# Patient Record
Sex: Male | Born: 1990 | Race: Black or African American | Hispanic: No | Marital: Single | Smoking: Never smoker
Health system: Southern US, Community
[De-identification: ages and names within clinical notes are randomized; demographics above are authoritative.]

---
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
## 2009-09-17 ENCOUNTER — Ambulatory Visit: Payer: Self-pay | Admitting: Family Medicine

## 2009-09-20 ENCOUNTER — Ambulatory Visit: Payer: Self-pay | Admitting: Family Medicine

## 2009-10-15 ENCOUNTER — Ambulatory Visit: Payer: Self-pay | Admitting: Family Medicine

## 2010-01-05 ENCOUNTER — Ambulatory Visit: Admission: AD | Admit: 2010-01-05 | Discharge: 2010-01-05 | Payer: Self-pay | Admitting: Emergency Medicine

## 2011-02-13 ENCOUNTER — Ambulatory Visit (INDEPENDENT_AMBULATORY_CARE_PROVIDER_SITE_OTHER): Payer: PRIVATE HEALTH INSURANCE | Admitting: Family Medicine

## 2011-02-13 DIAGNOSIS — N342 Other urethritis: Secondary | ICD-10-CM

## 2011-02-13 DIAGNOSIS — Z209 Contact with and (suspected) exposure to unspecified communicable disease: Secondary | ICD-10-CM

## 2013-05-03 ENCOUNTER — Encounter: Payer: Self-pay | Admitting: Medical

## 2013-05-03 ENCOUNTER — Ambulatory Visit (INDEPENDENT_AMBULATORY_CARE_PROVIDER_SITE_OTHER): Payer: PRIVATE HEALTH INSURANCE | Admitting: Medical

## 2013-05-03 VITALS — BP 112/70 | HR 78 | Temp 98.4°F | Resp 16 | Wt 253.0 lb

## 2013-05-03 DIAGNOSIS — I498 Other specified cardiac arrhythmias: Secondary | ICD-10-CM

## 2013-05-03 DIAGNOSIS — L729 Follicular cyst of the skin and subcutaneous tissue, unspecified: Secondary | ICD-10-CM

## 2013-05-03 DIAGNOSIS — R001 Bradycardia, unspecified: Secondary | ICD-10-CM

## 2013-05-03 DIAGNOSIS — L723 Sebaceous cyst: Secondary | ICD-10-CM

## 2013-05-03 NOTE — Progress Notes (Signed)
Subjective: Here for bump on left side of ribs x months.  Noticed out of the blue one day.  No change in size, doesn't hurt, no fever, no chills, no changes.  Along lower left anterior ribs.    He has concern about his pulse.  He donates plasma and on a handful of occasions has been declined at the plasma center due to low pulse in the 40s.  He denies any symptoms.  No chest pain, no palpitations, no edema, no syncope, no flushing.  No family hx/o heart disease.  Feels fine in general.  Exercises some, not daily, but at times can walk/run on the treadmill for an hour without c/o.   History reviewed. No pertinent past medical history.  ROS as in subjective   Objective: Filed Vitals:   05/03/13 1341  BP: 112/70  Pulse: 78  Temp: 98.4 F (36.9 C)  Resp: 16    General appearance: alert, no distress, WD/WN Skin: left lower anterior chest along 9th rib with subcutaneous with 3-4 mm diameter mobile, slightly tender, cystic lesion, no erythema, no induration, no warmth, no fluctuance, suggestive of cyst Neck: supple, no lymphadenopathy, no thyromegaly, no masses Heart: bradycardic at 58, otherwise RRR, normal S1, S2, no murmurs Lungs: CTA bilaterally, no wheezes, rhonchi, or rales Pulses: 2+ symmetric, upper and lower extremities, normal cap refill Ext: no edema   Adult ECG Report  Indication: bradycardia  Rate: 43bpm  Rhythm: sinus bradycardia  QRS Axis: 56 degrees  PR Interval:  QRS Duration: 98ms  QTc:  Conduction Disturbances: none  Other Abnormalities: none  Patient's cardiac risk factors are: none.  EKG comparison: none  Narrative Interpretation: marked sinus bradycardia, otherwise normal   Assessment: Encounter Diagnoses  Name Primary?  . Bradycardia Yes  . Cyst of skin      Plan: Bradycardia - asymptomatic.  Discussed EKG and case with Dr. Susann Givens, supervising physician.   We will see him back for labs, physical.  Pending labs, may consider baseline  cardiology evaluation.    Cyst of skin - no worrisome findings.  Reassured.  Call/return if worsening symptoms.   Follow-up soon for CPX

## 2013-05-25 ENCOUNTER — Encounter: Payer: Self-pay | Admitting: Medical

## 2013-06-26 ENCOUNTER — Emergency Department (HOSPITAL_COMMUNITY)
Admission: EM | Admit: 2013-06-26 | Discharge: 2013-06-27 | Disposition: A | Discharge status: Home / Self Care | Payer: PRIVATE HEALTH INSURANCE | Attending: Emergency Medicine | Admitting: Emergency Medicine

## 2013-06-26 ENCOUNTER — Encounter (HOSPITAL_COMMUNITY): Payer: Self-pay | Admitting: Emergency Medicine

## 2013-06-26 DIAGNOSIS — Z87891 Personal history of nicotine dependence: Secondary | ICD-10-CM | POA: Insufficient documentation

## 2013-06-26 DIAGNOSIS — R109 Unspecified abdominal pain: Secondary | ICD-10-CM

## 2013-06-26 DIAGNOSIS — R1013 Epigastric pain: Secondary | ICD-10-CM | POA: Insufficient documentation

## 2013-06-26 DIAGNOSIS — R1011 Right upper quadrant pain: Secondary | ICD-10-CM | POA: Insufficient documentation

## 2013-06-26 LAB — CBC WITH DIFFERENTIAL/PLATELET
Basophils Absolute: 0 10*3/uL (ref 0.0–0.1)
Basophils Relative: 1 % (ref 0–1)
Eosinophils Absolute: 0.5 10*3/uL (ref 0.0–0.7)
HCT: 41.6 % (ref 39.0–52.0)
Hemoglobin: 14.7 g/dL (ref 13.0–17.0)
Lymphocytes Relative: 38 % (ref 12–46)
MCH: 30.2 pg (ref 26.0–34.0)
MCHC: 35.3 g/dL (ref 30.0–36.0)
MCV: 85.6 fL (ref 78.0–100.0)
Neutro Abs: 3 10*3/uL (ref 1.7–7.7)
Neutrophils Relative %: 46 % (ref 43–77)
RDW: 12.5 % (ref 11.5–15.5)
WBC: 6.5 10*3/uL (ref 4.0–10.5)

## 2013-06-26 MED ORDER — GI COCKTAIL ~~LOC~~
30.0000 mL | Freq: Once | ORAL | Status: AC
  Administered 2013-06-26: 30 mL via ORAL
  Filled 2013-06-26: qty 30

## 2013-06-26 NOTE — ED Notes (Signed)
Med given 

## 2013-06-26 NOTE — ED Notes (Signed)
Patient with abdominal burning after eating chips and plum.  Patient denies any nausea or vomiting.  No other symptoms.

## 2013-06-26 NOTE — ED Provider Notes (Signed)
CSN: [id number]     Arrival date & time 06/26/13  2148 History   This chart was scribed for non-physician practitioner, Junious Silk PA-C, working with Audree Camel, MD by Arlan Organ, ED Scribe. This patient was seen in room TR11C/TR11C and the patient's care was started at 10:26 PM.    Chief Complaint  Patient presents with  . Abdominal Pain   The history is provided by the patient. No language interpreter was used.    HPI Comments: Barry Whitney is a 22 y.o. male who presents to the Emergency Department complaining of abdominal pain that started about an hour ago. Pt states he has experienced a "whoozy" feeling. Pt states he has taken some Pepto Bismol with no relief. Pt states he has eaten wings, french fries, chips, and a plum today. He states his last BM was last night. Pt denies nausea, emesis, and diarrhea. Pt states he has no current medical problems, but did have a heart murmur when he was a child.   History reviewed. No pertinent past medical history. History reviewed. No pertinent past surgical history. History reviewed. No pertinent family history. History  Substance Use Topics  . Smoking status: Never Smoker   . Smokeless tobacco: Not on file  . Alcohol Use: No    Review of Systems  Gastrointestinal: Positive for abdominal pain. Negative for nausea, vomiting and diarrhea.  All other systems reviewed and are negative.    Allergies  Review of patient's allergies indicates no known allergies.  Home Medications  No current outpatient prescriptions on file. BP 135/73  Pulse 46  Temp(Src) 98.7 F (37.1 C) (Oral)  Resp 16  SpO2 100% Physical Exam  Nursing note and vitals reviewed. Constitutional: He is oriented to person, place, and time. He appears well-developed and well-nourished. No distress.  HENT:  Head: Normocephalic and atraumatic.  Right Ear: External ear normal.  Left Ear: External ear normal.  Nose: Nose normal.  Eyes: Conjunctivae are  normal.  Neck: Normal range of motion. No tracheal deviation present.  Cardiovascular: Normal rate, regular rhythm and normal heart sounds.   Pulmonary/Chest: Effort normal and breath sounds normal. No stridor.  Abdominal: Soft. He exhibits no distension. There is no rebound and no guarding.  Tenderness to palpation over RUQ and epigastric region. Murphy's sign negative.  Musculoskeletal: Normal range of motion.  Neurological: He is alert and oriented to person, place, and time.  Skin: Skin is warm and dry. He is not diaphoretic.  Psychiatric: He has a normal mood and affect. His behavior is normal.    ED Course  Procedures (including critical care time)  DIAGNOSTIC STUDIES: Oxygen Saturation is 100% on RA, Normal by my interpretation.    COORDINATION OF CARE: 10:27 PM-Discussed treatment plan at bedside. Pt agreed to plan.     Labs Review Labs Reviewed  CBC WITH DIFFERENTIAL - Abnormal; Notable for the following:    Eosinophils Relative 8 (*)    All other components within normal limits  COMPREHENSIVE METABOLIC PANEL - Abnormal; Notable for the following:    Glucose, Bld 115 (*)    GFR calc non Af Amer 86 (*)    All other components within normal limits  LIPASE, BLOOD   Imaging Review No results found.  MDM   1. Abdominal pain    Patient is nontoxic, nonseptic appearing, in no apparent distress.  Patient's pain and other symptoms adequately managed in emergency department.  Labs and vitals reviewed.  Patient does not meet the SIRS  or Sepsis criteria.  On repeat exam patient does not have a surgical abdomen and there are no peritoneal signs.  No indication of appendicitis, bowel obstruction, bowel perforation, cholecystitis, diverticulitis.  Patient discharged home with symptomatic treatment and given strict instructions for follow-up with their primary care physician.  I have also discussed reasons to return immediately to the ER.  Patient expresses understanding and agrees  with plan. I discussed case with Dr. Criss Alvine who agrees with plan.   I personally performed the services described in this documentation, which was scribed in my presence. The recorded information has been reviewed and is accurate.         Mora Bellman, PA-C 06/27/13 [phone]

## 2013-06-27 ENCOUNTER — Ambulatory Visit (INDEPENDENT_AMBULATORY_CARE_PROVIDER_SITE_OTHER): Payer: PRIVATE HEALTH INSURANCE | Admitting: Family Medicine

## 2013-06-27 ENCOUNTER — Encounter: Payer: Self-pay | Admitting: Family Medicine

## 2013-06-27 VITALS — BP 90/64 | HR 52 | Temp 98.4°F | Ht 75.0 in | Wt 254.0 lb

## 2013-06-27 DIAGNOSIS — R1013 Epigastric pain: Secondary | ICD-10-CM

## 2013-06-27 DIAGNOSIS — R109 Unspecified abdominal pain: Secondary | ICD-10-CM

## 2013-06-27 LAB — POCT URINALYSIS DIPSTICK
Blood, UA: NEGATIVE
Glucose, UA: NEGATIVE
Ketones, UA: NEGATIVE
Leukocytes, UA: NEGATIVE
Urobilinogen, UA: NEGATIVE

## 2013-06-27 LAB — COMPREHENSIVE METABOLIC PANEL
BUN: 14 mg/dL (ref 6–23)
CO2: 26 mEq/L (ref 19–32)
Chloride: 103 mEq/L (ref 96–112)
GFR calc non Af Amer: 86 mL/min — ABNORMAL LOW (ref >90)
Glucose, Bld: 115 mg/dL — ABNORMAL HIGH (ref 70–99)
Potassium: 3.5 mEq/L (ref 3.5–5.1)
Total Bilirubin: 0.3 mg/dL (ref 0.3–1.2)
Total Protein: 7.3 g/dL (ref 6.0–8.3)

## 2013-06-27 MED ORDER — DEXLANSOPRAZOLE 60 MG PO CPDR: 60.0000 mg | DELAYED_RELEASE_CAPSULE | Freq: Every day | ORAL | Status: DC

## 2013-06-27 MED ORDER — RANITIDINE HCL 150 MG PO CAPS: 150.0000 mg | ORAL_CAPSULE | Freq: Every day | ORAL | Status: DC

## 2013-06-27 NOTE — Progress Notes (Signed)
Chief Complaint  Patient presents with  . Abdominal Pain    started last night went to ED. Was given rx for zantac, was not able to fill rx as 2 different pharmacies did not have in stock. Pt declined flu vaccine today.  . Cyst    saw Vincenza Hews in the past fot this and was supposed to follow up with him, wants to know if you can check for him today while he is here, left side of his ribcage area.    Pain started last night, around 9pm, around the umbilical area, and just above this area.  After less than an hour of pain, and no relief from [facility] he went to the ER.  Occurred after eating chips and a plum.  He was given a GI cocktail in the ER, which did soothe some of the discomfort.  Labs were done (normal CBC, lipase, chem).  He was given prescription for Zantac and told to follow up here.  He went to two different pharmacies and wasn't able to get the zantac filled.  Pain is 7/10 right now.  Pain has been pretty constant throughout the day. Feels like he has a ball of fire in his upper stomach.  Last night he had some belching, none today.  Bowels have been normal--no straining or diarrhea, no blood in stool.  Cyst L chest--never completely resolved.  Hasn't gotten larger, only minimally tender to touch.  Never drained.  History reviewed. No pertinent past medical history. History reviewed. No pertinent past surgical history. History   Social History  . Marital Status: Single    Spouse Name: N/A    Number of Children: N/A  . Years of Education: N/A   Occupational History  . labor    Social History Main Topics  . Smoking status: Former Smoker    Quit date: 04/11/2012  . Smokeless tobacco: Never Used  . Alcohol Use: No  . Drug Use: No  . Sexual Activity: Not on file   Other Topics Concern  . Not on file   Social History Narrative  . No narrative on file   Current outpatient prescriptions:ranitidine (ZANTAC) 150 MG capsule, Take 1 capsule (150 mg total) by mouth daily., Disp:  30 capsule, Rfl: 0 NOT taking the zantac  No Known Allergies  ROS:  Denies fevers, nausea (just slight yesterday), vomiting, bowel changes.  Denies dysuria, hematuria, urgency/frequency.  Denies URI symptoms, joint pains, bleeding/bruising/rash or other complaints.  PHYSICAL EXAM: BP 90/64  Pulse 52  Temp(Src) 98.4 F (36.9 C) (Oral)  Ht 6\' 3"  (1.905 m)  Wt 254 lb (115.214 kg)  BMI 31.75 kg/m2 Pleasant male in no distress Neck: no lymphadenopathy or mass Heart: Bradycardic with occasional ectopic beat, no murmur, rub or gallop Lungs: no wheezes, rales ronchi Abdomen: mildly tender in epigastrium.  Normal bowel sounds. No organomegaly or mass.  No rebound tenderness or guarding. Negative Murphy Extremities: no edema Skin: no rash Psych: normal mood, affect, hygiene and grooming Neuro: alert and oriented.  Cranial nerves grossly intact. Normal strength, gait Skin: Left lower chest, overlying floating ribs is a small (1.5 x 1 cm) mobile, nontender, nonfluctuant, nonindurated subcutaneous mass  ASSESSMENT/PLAN:  Abdominal pain, epigastric - Plan: dexlansoprazole (DEXILANT) 60 MG capsule  Abdominal pain - Plan: POCT Urinalysis Dipstick  Epigastric pain.  Likely reflux vs gastritis. Discussed differential diagnosis in detail (including gallstones, gastritis, reflux, virus, appendicitis, bowel obsturction).   Given Dexilant samples to take once daily until pain resolves. F/u  if pain persists despite meds, sooner if worse (fevers, vomiting, worsening pain).  IN addition to reflux diet/precautions, encouraged lowfat diet, avoid fried/greasy foods.  Slowly advance as tolerated.  Avoid aspirin and anti-inflammatories. Avoid spicy, acidic foods, caffeine.  I'm giving you a handout that talks about reflux--you might not truly have reflux, just irritation of the stomach, but the diet is the same. Take the sample medication given once daily--use this in place of the ranitidine (it is stronger  and better).  Use it for 2 weeks then stop.  If you pain doesn't completely, return to discuss in more detail.   Over-the-counter Prilosec and Nexium are similar, so if you have recurrences, you can use these medications.  If you have chronic, ongoing pain, that requires longterm daily medication, you need to return to discuss this with your doctor.    Return for recheck of the lump on your lower left chest if it suddenly enlarges, becomes more painful, red, fevers, drains, or other concerns.

## 2013-06-27 NOTE — Patient Instructions (Addendum)
  Avoid aspirin and anti-inflammatories. Avoid spicy, acidic foods, caffeine.  I'm giving you a handout that talks about reflux--you might not truly have reflux, just irritation of the stomach, but the diet is the same. Take the sample medication given once daily--use this in place of the ranitidine (it is stronger and better).  Use it for 2 weeks then stop.  If you pain doesn't completely, return to discuss in more detail.   Over-the-counter Prilosec and Nexium are similar, so if you have recurrences, you can use these medications.  If you have chronic, ongoing pain, that requires longterm daily medication, you need to return to discuss this with your doctor.    Return for recheck of the lump on your lower left chest if it suddenly enlarges, becomes more painful, red, fevers, drains, or other concerns.   Diet for Gastroesophageal Reflux Disease, Adult Reflux (acid reflux) is when acid from your stomach flows up into the esophagus. When acid comes in contact with the esophagus, the acid causes irritation and soreness (inflammation) in the esophagus. When reflux happens often or so severely that it causes damage to the esophagus, it is called gastroesophageal reflux disease (GERD). Nutrition therapy can help ease the discomfort of GERD. FOODS OR DRINKS TO AVOID OR LIMIT  Smoking or chewing tobacco. Nicotine is one of the most potent stimulants to acid production in the gastrointestinal tract.  Caffeinated and decaffeinated coffee and black tea.  Regular or low-calorie carbonated beverages or energy drinks (caffeine-free carbonated beverages are allowed).   Strong spices, such as black pepper, white pepper, red pepper, cayenne, curry powder, and chili powder.  Peppermint or spearmint.  Chocolate.  High-fat foods, including meats and fried foods. Extra added fats including oils, butter, salad dressings, and nuts. Limit these to less than 8 tsp per day.  Fruits and vegetables if they are not  tolerated, such as citrus fruits or tomatoes.  Alcohol.  Any food that seems to aggravate your condition. If you have questions regarding your diet, call your caregiver or a registered dietitian. OTHER THINGS THAT MAY HELP GERD INCLUDE:   Eating your meals slowly, in a relaxed setting.  Eating 5 to 6 small meals per day instead of 3 large meals.  Eliminating food for a period of time if it causes distress.  Not lying down until 3 hours after eating a meal.  Keeping the head of your bed raised 6 to 9 inches (15 to 23 cm) by using a foam wedge or blocks under the legs of the bed. Lying flat may make symptoms worse.  Being physically active. Weight loss may be helpful in reducing reflux in overweight or obese adults.  Wear loose fitting clothing EXAMPLE MEAL PLAN This meal plan is approximately 2,000 calories based on https://www.[facility].org/ meal planning guidelines. Breakfast   cup cooked oatmeal.  1 cup strawberries.  1 cup low-fat milk.  1 oz almonds. Snack  1 cup cucumber slices.  6 oz yogurt (made from low-fat or fat-free milk). Lunch  2 slice whole-wheat bread.  2 oz sliced [place].  2 tsp mayonnaise.  1 cup blueberries.  1 cup snap peas. Snack  6 whole-wheat crackers.  1 oz string cheese. Dinner   cup brown rice.  1 cup mixed veggies.  1 tsp olive oil.  3 oz grilled fish. Document Released: 09/29/2005 Document Revised: 12/22/2011 Document Reviewed: 08/15/2011 [facility] Patient Information 2014 [place], [place].

## 2013-06-27 NOTE — ED Provider Notes (Signed)
Medical screening examination/treatment/procedure(s) were performed by non-physician practitioner and as supervising physician I was immediately available for consultation/collaboration.   Audree Camel, MD 06/27/13 1255

## 2014-01-27 ENCOUNTER — Ambulatory Visit: Payer: PRIVATE HEALTH INSURANCE | Admitting: Medical

## 2014-02-06 ENCOUNTER — Encounter: Payer: Self-pay | Admitting: Medical

## 2014-03-02 ENCOUNTER — Encounter: Payer: Self-pay | Admitting: Medical

## 2014-03-02 ENCOUNTER — Ambulatory Visit (INDEPENDENT_AMBULATORY_CARE_PROVIDER_SITE_OTHER): Payer: PRIVATE HEALTH INSURANCE | Admitting: Medical

## 2014-03-02 VITALS — BP 130/80 | HR 66 | Temp 98.1°F | Resp 16 | Wt 244.0 lb

## 2014-03-02 DIAGNOSIS — IMO0002 Reserved for concepts with insufficient information to code with codable children: Secondary | ICD-10-CM

## 2014-03-02 DIAGNOSIS — M62830 Muscle spasm of back: Secondary | ICD-10-CM

## 2014-03-02 DIAGNOSIS — S39012A Strain of muscle, fascia and tendon of lower back, initial encounter: Secondary | ICD-10-CM

## 2014-03-02 DIAGNOSIS — M5380 Other specified dorsopathies, site unspecified: Secondary | ICD-10-CM

## 2014-03-02 MED ORDER — CYCLOBENZAPRINE HCL 10 MG PO TABS: 10.0000 mg | ORAL_TABLET | Freq: Every day | ORAL | Status: DC

## 2014-03-02 NOTE — Patient Instructions (Signed)
  Thank you for giving me the opportunity to serve you today.    Your diagnosis today includes: Encounter Diagnoses  Name Primary?  . Back spasm Yes  . Back strain      Specific recommendations today include:  Use over-the-counter Aleve one to 2 tablets twice daily for pain and inflammation  Or instead you can use ibuprofen over-the-counter 3-4 tablets 3 times a day  Use muscle relaxer Flexeril 1/2-1 tablet either at bedtime or up to twice a day if needed. Caution this may cause drowsiness  Use heat, gentle stretching, rest  Symptoms should resolve over the next week

## 2014-03-02 NOTE — Progress Notes (Signed)
Subjective:    Barry Whitney is a 23 y.o. male who presents for back pain x 4 days.  Just on the left side.  Pain is mid back, at times burning pain.  No radiation.  He notes no prior similar.  He does a lot of twisting and turning at work, may have pulled a muscle doing this.  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   Using some aleve and ibuprofen with some relief.  symptoms are worse since Monday.  No numbness, tingling, or weakness in the legs.   No belly pain.  No GU or GI changes.   No other relieving or aggravating factors. No other c/o.   The following portions of the patient's history were reviewed and updated as appropriate: allergies, current medications, past family history, past medical history, past social history, past surgical history and problem list.  Review of Systems Constitutional: denies fever, chills, sweats, unexpected weight change, anorexia, fatigue Dermatology: rash, bruising, redness Cardiology: denies chest pain, palpitations, edema, orthopnea, paroxysmal nocturnal dyspnea Respiratory: denies cough, shortness of breath, dyspnea on exertion, wheezing, hemoptysis Gastroenterology: denies abdominal pain, nausea, vomiting, diarrhea, constipation, blood in stool, changes in bowel movement, dysphagia Musculoskeletal: denies arthralgias, myalgias, joint swelling, neck pain, cramping Urology: denies dysuria, difficulty urinating, hematuria, urinary frequency, urgency, incontinence Neurology: no headache, weakness, tingling, numbness, speech abnormality, memory loss, falls, dizziness      Objective:    Filed Vitals:   03/02/14 1426  BP: 130/80  Pulse: 66  Temp: 98.1 F (36.7 C)  Resp: 16    General appearance: alert, no distress, WD/WN, male Neck: supple, no lymphadenopathy, no thyromegaly, no masses, normal ROM Chest: tender left upper chest wall, otherwise non tender, normal shape and expansion Heart: RRR, normal S1, S2, no  murmurs Lungs: CTA bilaterally, no wheezes, rhonchi, or rales Abdomen: +bs, soft, non tender, non distended, no masses, no hepatomegaly, no splenomegaly, no bruits Back: Tender left mid paraspinal region, otherwise nontender, normal range of motion, no scoliosis MSK: UE nontender, normal ROM, LE nontender, normal ROM, no obvious deformity Extremities: no edema, no cyanosis, no clubbing Pulses: 2+ symmetric, upper and lower extremities     Assessment:   Encounter Diagnoses  Name Primary?  . Back spasm Yes  . Back strain       Plan:   Natural history and expected course discussed. Questions answered. Agricultural engineerducational material distributed. Proper lifting, bending technique discussed. Stretching exercises discussed. Regular aerobic and trunk strengthening exercises discussed. Short (3-4 day) period of relative rest recommended until acute symptoms improve. Heat to affected area as needed for local pain relief.  Medications prescribed: NSAIDs per medication orders. Muscle relaxants per medication orders.  Note given for work   Follow up: prn

## 2014-07-26 ENCOUNTER — Encounter (HOSPITAL_COMMUNITY): Payer: Self-pay | Admitting: Emergency Medicine

## 2014-07-26 ENCOUNTER — Emergency Department (HOSPITAL_COMMUNITY)
Admission: EM | Admit: 2014-07-26 | Discharge: 2014-07-26 | Disposition: A | Discharge status: Home / Self Care | Payer: PRIVATE HEALTH INSURANCE | Attending: Emergency Medicine | Admitting: Emergency Medicine

## 2014-07-26 ENCOUNTER — Emergency Department (HOSPITAL_COMMUNITY): Payer: PRIVATE HEALTH INSURANCE

## 2014-07-26 DIAGNOSIS — R1084 Generalized abdominal pain: Secondary | ICD-10-CM

## 2014-07-26 DIAGNOSIS — R197 Diarrhea, unspecified: Secondary | ICD-10-CM

## 2014-07-26 DIAGNOSIS — R739 Hyperglycemia, unspecified: Secondary | ICD-10-CM

## 2014-07-26 DIAGNOSIS — K529 Noninfective gastroenteritis and colitis, unspecified: Secondary | ICD-10-CM

## 2014-07-26 DIAGNOSIS — Z87891 Personal history of nicotine dependence: Secondary | ICD-10-CM | POA: Insufficient documentation

## 2014-07-26 LAB — COMPREHENSIVE METABOLIC PANEL
ALT: 20 U/L (ref 0–53)
AST: 20 U/L (ref 0–37)
Albumin: 3.6 g/dL (ref 3.5–5.2)
Alkaline Phosphatase: 43 U/L (ref 39–117)
Anion gap: 12 (ref 5–15)
BUN: 11 mg/dL (ref 6–23)
CALCIUM: 8.9 mg/dL (ref 8.4–10.5)
CHLORIDE: 106 meq/L (ref 96–112)
CO2: 22 meq/L (ref 19–32)
Creatinine, Ser: 1.01 mg/dL (ref 0.50–1.35)
GFR calc Af Amer: >90 mL/min (ref >90)
GFR calc non Af Amer: >90 mL/min (ref >90)
GLUCOSE: 117 mg/dL — AB (ref 70–99)
Potassium: 4 mEq/L (ref 3.7–5.3)
SODIUM: 140 meq/L (ref 137–147)
TOTAL PROTEIN: 6.9 g/dL (ref 6.0–8.3)
Total Bilirubin: 0.4 mg/dL (ref 0.3–1.2)

## 2014-07-26 LAB — CBC WITH DIFFERENTIAL/PLATELET
BASOS ABS: 0 10*3/uL (ref 0.0–0.1)
Basophils Relative: 0 % (ref 0–1)
EOS ABS: 0.4 10*3/uL (ref 0.0–0.7)
Eosinophils Relative: 9 % — ABNORMAL HIGH (ref 0–5)
HEMATOCRIT: 47.6 % (ref 39.0–52.0)
HEMOGLOBIN: 16.5 g/dL (ref 13.0–17.0)
Lymphocytes Relative: 34 % (ref 12–46)
Lymphs Abs: 1.5 10*3/uL (ref 0.7–4.0)
MCH: 30.5 pg (ref 26.0–34.0)
MCHC: 34.7 g/dL (ref 30.0–36.0)
MCV: 88 fL (ref 78.0–100.0)
Monocytes Absolute: 0.3 10*3/uL (ref 0.1–1.0)
Monocytes Relative: 7 % (ref 3–12)
NEUTROS ABS: 2.3 10*3/uL (ref 1.7–7.7)
Neutrophils Relative %: 50 % (ref 43–77)
Platelets: 249 10*3/uL (ref 150–400)
RBC: 5.41 MIL/uL (ref 4.22–5.81)
RDW: 12.3 % (ref 11.5–15.5)
WBC: 4.5 10*3/uL (ref 4.0–10.5)

## 2014-07-26 LAB — URINALYSIS, ROUTINE W REFLEX MICROSCOPIC
BILIRUBIN URINE: NEGATIVE
GLUCOSE, UA: NEGATIVE mg/dL
HGB URINE DIPSTICK: NEGATIVE
Ketones, ur: NEGATIVE mg/dL
Leukocytes, UA: NEGATIVE
Nitrite: NEGATIVE
PROTEIN: NEGATIVE mg/dL
Specific Gravity, Urine: 1.024 (ref 1.005–1.030)
UROBILINOGEN UA: 0.2 mg/dL (ref 0.0–1.0)
pH: 5 (ref 5.0–8.0)

## 2014-07-26 LAB — LIPASE, BLOOD: LIPASE: 25 U/L (ref 11–59)

## 2014-07-26 MED ORDER — DICYCLOMINE HCL 20 MG PO TABS: 20.0000 mg | ORAL_TABLET | Freq: Two times a day (BID) | ORAL | Status: DC

## 2014-07-26 MED ORDER — LOPERAMIDE HCL 2 MG PO CAPS: 2.0000 mg | ORAL_CAPSULE | Freq: Four times a day (QID) | ORAL | Status: DC | PRN

## 2014-07-26 NOTE — ED Notes (Signed)
Reports generalized abd pain and cramping x 3 days. Reports diarrhea but denies any n/v.

## 2014-07-26 NOTE — Discharge Instructions (Signed)
Abdominal (belly) pain can be caused by many things. Your caregiver performed an examination and possibly ordered blood/urine tests and imaging (CT scan, x-rays, ultrasound). Many cases can be observed and treated at home after initial evaluation in the emergency department. Even though you are being discharged home, abdominal pain can be unpredictable. Therefore, you need a repeated exam if your pain does not resolve, returns, or worsens. Most patients with abdominal pain don't have to be admitted to the hospital or have surgery, but serious problems like appendicitis and gallbladder attacks can start out as nonspecific pain. Many abdominal conditions cannot be diagnosed in one visit, so follow-up evaluations are very important. SEEK IMMEDIATE MEDICAL ATTENTION IF: The pain does not go away or becomes severe.  A temperature above 101 develops.  Repeated vomiting occurs (multiple episodes).  The pain becomes localized to portions of the abdomen. The right side could possibly be appendicitis. In an adult, the left lower portion of the abdomen could be colitis or diverticulitis.  Blood is being passed in stools or vomit (bright red or black tarry stools).  Return also if you develop chest pain, difficulty breathing, dizziness or fainting, or become confused, poorly responsive, or inconsolable (young children).    Hyperglycemia Hyperglycemia occurs when the glucose (sugar) in your blood is too high. Hyperglycemia can happen for many reasons, but it most often happens to people who do not know they have diabetes or are not managing their diabetes properly.  CAUSES  Whether you have diabetes or not, there are other causes of hyperglycemia. Hyperglycemia can occur when you have diabetes, but it can also occur in other situations that you might not be as aware of, such as: Diabetes  If you have diabetes and are having problems controlling your blood glucose, hyperglycemia could occur because of some of  the following reasons:  Not following your meal plan.  Not taking your diabetes medications or not taking it properly.  Exercising less or doing less activity than you normally do.  Being sick. Pre-diabetes  This cannot be ignored. Before people develop Type 2 diabetes, they almost always have "pre-diabetes." This is when your blood glucose levels are higher than normal, but not yet high enough to be diagnosed as diabetes. Research has shown that some long-term damage to the body, especially the heart and circulatory system, may already be occurring during pre-diabetes. If you take action to manage your blood glucose when you have pre-diabetes, you may delay or prevent Type 2 diabetes from developing. Stress  If you have diabetes, you may be "diet" controlled or on oral medications or insulin to control your diabetes. However, you may find that your blood glucose is higher than usual in the hospital whether you have diabetes or not. This is often referred to as "stress hyperglycemia." Stress can elevate your blood glucose. This happens because of hormones put out by the body during times of stress. If stress has been the cause of your high blood glucose, it can be followed regularly by your caregiver. That way he/she can make sure your hyperglycemia does not continue to get worse or progress to diabetes. Steroids  Steroids are medications that act on the infection fighting system (immune system) to block inflammation or infection. One side effect can be a rise in blood glucose. Most people can produce enough extra insulin to allow for this rise, but for those who cannot, steroids make blood glucose levels go even higher. It is not unusual for steroid treatments to "uncover"  diabetes that is developing. It is not always possible to determine if the hyperglycemia will go away after the steroids are stopped. A special blood test called an A1c is sometimes done to determine if your blood glucose was  elevated before the steroids were started. SYMPTOMS  Thirsty.  Frequent urination.  Dry mouth.  Blurred vision.  Tired or fatigue.  Weakness.  Sleepy.  Tingling in feet or leg. DIAGNOSIS  Diagnosis is made by monitoring blood glucose in one or all of the following ways:  A1c test. This is a chemical found in your blood.  Fingerstick blood glucose monitoring.  Laboratory results. TREATMENT  First, knowing the cause of the hyperglycemia is important before the hyperglycemia can be treated. Treatment may include, but is not be limited to:  Education.  Change or adjustment in medications.  Change or adjustment in meal plan.  Treatment for an illness, infection, etc.  More frequent blood glucose monitoring.  Change in exercise plan.  Decreasing or stopping steroids.  Lifestyle changes. HOME CARE INSTRUCTIONS   Test your blood glucose as directed.  Exercise regularly. Your caregiver will give you instructions about exercise. Pre-diabetes or diabetes which comes on with stress is helped by exercising.  Eat wholesome, balanced meals. Eat often and at regular, fixed times. Your caregiver or nutritionist will give you a meal plan to guide your sugar intake.  Being at an ideal weight is important. If needed, losing as little as 10 to 15 pounds may help improve blood glucose levels. SEEK MEDICAL CARE IF:   You have questions about medicine, activity, or diet.  You continue to have symptoms (problems such as increased thirst, urination, or weight gain). SEEK IMMEDIATE MEDICAL CARE IF:   You are vomiting or have diarrhea.  Your breath smells fruity.  You are breathing faster or slower.  You are very sleepy or incoherent.  You have numbness, tingling, or pain in your feet or hands.  You have chest pain.  Your symptoms get worse even though you have been following your caregiver's orders.  If you have any other questions or concerns. Document Released:  03/25/2001 Document Revised: 12/22/2011 Document Reviewed: 01/26/2012 [facility] Patient Information 2015 [employer], [place]. This information is not intended to replace advice given to you by your health care provider. Make sure you discuss any questions you have with your health care provider.   Diarrhea Diarrhea is frequent loose and watery bowel movements. It can cause you to feel weak and dehydrated. Dehydration can cause you to become tired and thirsty, have a dry mouth, and have decreased urination that often is dark yellow. Diarrhea is a sign of another problem, most often an infection that will not last long. In most cases, diarrhea typically lasts 2-3 days. However, it can last longer if it is a sign of something more serious. It is important to treat your diarrhea as directed by your caregiver to lessen or prevent future episodes of diarrhea. CAUSES  Some common causes include:  Gastrointestinal infections caused by viruses, bacteria, or parasites.  Food poisoning or food allergies.  Certain medicines, such as antibiotics, chemotherapy, and laxatives.  Artificial sweeteners and fructose.  Digestive disorders. HOME CARE INSTRUCTIONS  Ensure adequate fluid intake (hydration): Have 1 cup (8 oz) of fluid for each diarrhea episode. Avoid fluids that contain simple sugars or sports drinks, fruit juices, whole milk products, and sodas. Your urine should be clear or pale yellow if you are drinking enough fluids. Hydrate with an oral rehydration solution  that you can purchase at pharmacies, retail stores, and online. You can prepare an oral rehydration solution at home by mixing the following ingredients together:   - tsp table salt.   tsp baking soda.   tsp salt substitute containing potassium chloride.  1  tablespoons sugar.  1 L (34 oz) of water.  Certain foods and beverages may increase the speed at which food moves through the gastrointestinal (GI) tract. These foods and beverages  should be avoided and include:  Caffeinated and alcoholic beverages.  High-fiber foods, such as raw fruits and vegetables, nuts, seeds, and whole grain breads and cereals.  Foods and beverages sweetened with sugar alcohols, such as xylitol, sorbitol, and mannitol.  Some foods may be well tolerated and may help thicken stool including:  Starchy foods, such as rice, toast, pasta, low-sugar cereal, oatmeal, grits, baked potatoes, crackers, and bagels.  Bananas.  Applesauce.  Add probiotic-rich foods to help increase healthy bacteria in the GI tract, such as yogurt and fermented milk products.  Wash your hands well after each diarrhea episode.  Only take over-the-counter or prescription medicines as directed by your caregiver.  Take a warm bath to relieve any burning or pain from frequent diarrhea episodes. SEEK IMMEDIATE MEDICAL CARE IF:   You are unable to keep fluids down.  You have persistent vomiting.  You have blood in your stool, or your stools are black and tarry.  You do not urinate in 6-8 hours, or there is only a small amount of very dark urine.  You have abdominal pain that increases or localizes.  You have weakness, dizziness, confusion, or light-headedness.  You have a severe headache.  Your diarrhea gets worse or does not get better.  You have a fever or persistent symptoms for more than 2-3 days.  You have a fever and your symptoms suddenly get worse. MAKE SURE YOU:   Understand these instructions.  Will watch your condition.  Will get help right away if you are not doing well or get worse. Document Released: 09/19/2002 Document Revised: 02/13/2014 Document Reviewed: 06/06/2012 [facility] Patient Information 2015 [place], [place]. This information is not intended to replace advice given to you by your health care provider. Make sure you discuss any questions you have with your health care provider. Viral Gastroenteritis Viral gastroenteritis is also  known as stomach flu. This condition affects the stomach and intestinal tract. It can cause sudden diarrhea and vomiting. The illness typically lasts 3 to 8 days. Most people develop an immune response that eventually gets rid of the virus. While this natural response develops, the virus can make you quite ill. CAUSES  Many different viruses can cause gastroenteritis, such as rotavirus or noroviruses. You can catch one of these viruses by consuming contaminated food or water. You may also catch a virus by sharing utensils or other personal items with an infected person or by touching a contaminated surface. SYMPTOMS  The most common symptoms are diarrhea and vomiting. These problems can cause a severe loss of body fluids (dehydration) and a body salt (electrolyte) imbalance. Other symptoms may include:  Fever.  Headache.  Fatigue.  Abdominal pain. DIAGNOSIS  Your caregiver can usually diagnose viral gastroenteritis based on your symptoms and a physical exam. A stool sample may also be taken to test for the presence of viruses or other infections. TREATMENT  This illness typically goes away on its own. Treatments are aimed at rehydration. The most serious cases of viral gastroenteritis involve vomiting so severely that  you are not able to keep fluids down. In these cases, fluids must be given through an intravenous line (IV). HOME CARE INSTRUCTIONS   Drink enough fluids to keep your urine clear or pale yellow. Drink small amounts of fluids frequently and increase the amounts as tolerated.  Ask your caregiver for specific rehydration instructions.  Avoid:  Foods high in sugar.  Alcohol.  Carbonated drinks.  Tobacco.  Juice.  Caffeine drinks.  Extremely hot or cold fluids.  Fatty, greasy foods.  Too much intake of anything at one time.  Dairy products until 24 to 48 hours after diarrhea stops.  You may consume probiotics. Probiotics are active cultures of beneficial  bacteria. They may lessen the amount and number of diarrheal stools in adults. Probiotics can be found in yogurt with active cultures and in supplements.  Wash your hands well to avoid spreading the virus.  Only take over-the-counter or prescription medicines for pain, discomfort, or fever as directed by your caregiver. Do not give aspirin to children. Antidiarrheal medicines are not recommended.  Ask your caregiver if you should continue to take your regular prescribed and over-the-counter medicines.  Keep all follow-up appointments as directed by your caregiver. SEEK IMMEDIATE MEDICAL CARE IF:   You are unable to keep fluids down.  You do not urinate at least once every 6 to 8 hours.  You develop shortness of breath.  You notice blood in your stool or vomit. This may look like coffee grounds.  You have abdominal pain that increases or is concentrated in one small area (localized).  You have persistent vomiting or diarrhea.  You have a fever.  The patient is a child younger than 3 months, and he or she has a fever.  The patient is a child older than 3 months, and he or she has a fever and persistent symptoms.  The patient is a child older than 3 months, and he or she has a fever and symptoms suddenly get worse.  The patient is a baby, and he or she has no tears when crying. MAKE SURE YOU:   Understand these instructions.  Will watch your condition.  Will get help right away if you are not doing well or get worse. Document Released: 09/29/2005 Document Revised: 12/22/2011 Document Reviewed: 07/16/2011 [facility] Patient Information 2015 [place], [place]. This information is not intended to replace advice given to you by your health care provider. Make sure you discuss any questions you have with your health care provider.

## 2014-07-26 NOTE — ED Notes (Signed)
Off unit with xray. 

## 2014-07-26 NOTE — ED Provider Notes (Signed)
CSN: [id number]     Arrival date & time 07/26/14  1704 History   First MD Initiated Contact with Patient 07/26/14 1802     Chief Complaint  Patient presents with  . Abdominal Pain     (Consider location/radiation/quality/duration/timing/severity/associated sxs/prior Treatment) HPI  Barry Whitney is a(n) 23 y.o. male who presents to the ED with cc of abdominal cramping and diarrhea for the past 3 days. He has had 4-5 episodes of loose stools daily. He denies nausea or vomiting. 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 He states that he needs a work note.She denies contacts with similar sxs, ingestion of suspect foods or water, history of similar sxs, recent foreign travel . He has not taken any medications for diarrhea.   History reviewed. No pertinent past medical history. History reviewed. No pertinent past surgical history. History reviewed. No pertinent family history. History  Substance Use Topics  . Smoking status: Former Smoker    Quit date: 04/11/2012  . Smokeless tobacco: Never Used  . Alcohol Use: No    Review of Systems  Ten systems reviewed and are negative for acute change, except as noted in the HPI.    Allergies  Review of patient's allergies indicates no known allergies.  Home Medications   Prior to Admission medications   Not on File   BP 112/67  Pulse 52  Temp(Src) 98.5 F (36.9 C) (Oral)  Resp 16  SpO2 100% Physical Exam Physical Exam  Nursing note and vitals reviewed. Constitutional: He appears well-developed and well-nourished. No distress.  HENT:  Head: Normocephalic and atraumatic.  Eyes: Conjunctivae normal are normal. No scleral icterus.  Neck: Normal range of motion. Neck supple.  Cardiovascular: Normal rate, regular rhythm and normal heart sounds.   Pulmonary/Chest: Effort normal and breath sounds normal. No respiratory distress.  Abdominal: Soft.  There is no tenderness.  Musculoskeletal: He exhibits no edema.  Neurological: He is alert.  Skin: Skin is warm and dry. He is not diaphoretic.  Psychiatric: His behavior is normal.    ED Course  Procedures (including critical care time) Labs Review Labs Reviewed  COMPREHENSIVE METABOLIC PANEL - Abnormal; Notable for the following:    Glucose, Bld 117 (*)    All other components within normal limits  CBC WITH DIFFERENTIAL - Abnormal; Notable for the following:    Eosinophils Relative 9 (*)    All other components within normal limits  LIPASE, BLOOD  URINALYSIS, ROUTINE W REFLEX MICROSCOPIC    Imaging Review Dg Abd Acute W/chest  07/26/2014   CLINICAL DATA:  Abdominal pain, increased bowel movements and abdominal cramping for 3 days  EXAM: ACUTE ABDOMEN SERIES (ABDOMEN 2 VIEW & CHEST 1 VIEW)  COMPARISON:  None.  FINDINGS: Cardiomediastinal silhouette is unremarkable. No acute infiltrate or pleural effusion. No pulmonary edema. Mild gaseous distended small bowel loops mid abdomen suspicious for ileus or enteritis. No free abdominal air.  IMPRESSION: No acute disease within chest. Mild gaseous distended small bowel loops mid abdomen suspicious for ileus or enteritis. No free abdominal air.   Electronically Signed   By: Natasha MeadLiviu  Pop M.D.   On: 07/26/2014 20:43     EKG Interpretation None      MDM   Final diagnoses:  None    Patient is nontoxic, nonseptic appearing, in no apparent distress.  Patient's pain and other symptoms adequately managed in emergency department.  Fluid bolus given.  Labs, imaging and vitals reviewed.  Patient does not meet the SIRS or Sepsis criteria.  On repeat exam patient does not have a surgical abdomin and there are no peritoneal signs.  No indication of appendicitis, bowel obstruction, bowel perforation, cholecystitis, diverticulitis.  Patient discharged home with symptomatic treatment and given strict instructions for follow-up with their primary care  physician.  I have also discussed reasons to return immediately to the ER.  Patient expresses understanding and agrees with plan.       Arthor CaptainAbigail Ossie Yebra, PA-C 07/31/14 1108

## 2014-07-26 NOTE — ED Notes (Signed)
Pt back from xray.  PA at bedside.

## 2014-07-31 NOTE — ED Provider Notes (Signed)
Medical screening examination/treatment/procedure(s) were performed by non-physician practitioner and as supervising physician I was immediately available for consultation/collaboration.   EKG Interpretation None        Joya Gaskinsonald W Sharrell Krawiec, MD 07/31/14 2059

## 2014-08-09 ENCOUNTER — Ambulatory Visit (INDEPENDENT_AMBULATORY_CARE_PROVIDER_SITE_OTHER): Payer: PRIVATE HEALTH INSURANCE | Admitting: Medical

## 2014-08-09 ENCOUNTER — Encounter: Payer: Self-pay | Admitting: Medical

## 2014-08-09 VITALS — BP 120/70 | HR 60 | Temp 98.5°F | Resp 16 | Wt 240.0 lb

## 2014-08-09 DIAGNOSIS — J069 Acute upper respiratory infection, unspecified: Secondary | ICD-10-CM

## 2014-08-09 NOTE — Progress Notes (Signed)
Subjective:  Barry Whitney is a 23 y.o. male who presents for flu like symptoms.  Started last night with chills, sweats, cough, sore throat, headache, sneezing, felt feverish, nasal congestion, runny nose.   Had to leave work from feeling ill.  Denies NVD, stomach pain, sinus pressure, ear pain, wheezing, SOB.  Treatment to date: cough suppressants and decongestants. +fiance and daughter, both sick contacts.  No other aggravating or relieving factors.  No other c/o.  ROS as in subjective.   Objective: Filed Vitals:   08/09/14 1336  BP: 120/70  Pulse: 60  Temp: 98.5 F (36.9 C)  Resp: 16    General appearance: Alert, WD/WN, no distress, mildly ill appearing                             Skin: warm, no rash                           Head: no sinus tenderness                            Eyes: conjunctiva normal, corneas clear, PERRLA                            Ears: pearly TMs, external ear canals normal                          Nose: septum midline, turbinates swollen, with erythema and clear discharge             Mouth/throat: MMM, tongue normal, mild pharyngeal erythema                           Neck: supple, no adenopathy, no thyromegaly, nontender                          Heart: RRR, normal S1, S2, no murmurs                         Lungs: CTA bilaterally, no wheezes, rales, or rhonchi     Assessment: Encounter Diagnosis  Name Primary?  Marland Kitchen. Upper respiratory infection Yes     Plan: Discussed diagnosis and treatment of URI.  Suggested symptomatic OTC remedies. Nasal saline spray for congestion.  Tylenol or Ibuprofen OTC for fever and malaise.  Call/return in 2-3 days if symptoms aren't resolving.

## 2015-04-02 ENCOUNTER — Ambulatory Visit (INDEPENDENT_AMBULATORY_CARE_PROVIDER_SITE_OTHER): Payer: PRIVATE HEALTH INSURANCE | Admitting: Emergency Medicine

## 2015-04-02 VITALS — BP 108/60 | HR 70 | Temp 98.8°F | Resp 16 | Ht 74.0 in | Wt 225.8 lb

## 2015-04-02 DIAGNOSIS — K529 Noninfective gastroenteritis and colitis, unspecified: Secondary | ICD-10-CM | POA: Diagnosis not present

## 2015-04-02 LAB — POCT CBC
Granulocyte percent: 56.2 %G (ref 37–80)
HCT, POC: 43.8 % (ref 43.5–53.7)
HEMOGLOBIN: 14.4 g/dL (ref 14.1–18.1)
LYMPH, POC: 2 (ref 0.6–3.4)
MCH: 29.1 pg (ref 27–31.2)
MCHC: 32.9 g/dL (ref 31.8–35.4)
MCV: 88.5 fL (ref 80–97)
MID (cbc): 0.2 (ref 0–0.9)
MPV: 8.2 fL (ref 0–99.8)
POC GRANULOCYTE: 2.8 (ref 2–6.9)
POC LYMPH %: 40 % (ref 10–50)
POC MID %: 3.8 % (ref 0–12)
Platelet Count, POC: 214 10*3/uL (ref 142–424)
RBC: 4.94 M/uL (ref 4.69–6.13)
RDW, POC: 12.6 %
WBC: 4.9 10*3/uL (ref 4.6–10.2)

## 2015-04-02 MED ORDER — LOPERAMIDE HCL 2 MG PO TABS: ORAL_TABLET | ORAL | Status: DC

## 2015-04-02 MED ORDER — ONDANSETRON 8 MG PO TBDP: 8.0000 mg | ORAL_TABLET | Freq: Three times a day (TID) | ORAL | Status: DC | PRN

## 2015-04-02 NOTE — Patient Instructions (Signed)
Viral Gastroenteritis Viral gastroenteritis is also known as stomach flu. This condition affects the stomach and intestinal tract. It can cause sudden diarrhea and vomiting. The illness typically lasts 3 to 8 days. Most people develop an immune response that eventually gets rid of the virus. While this natural response develops, the virus can make you quite ill. CAUSES  Many different viruses can cause gastroenteritis, such as rotavirus or noroviruses. You can catch one of these viruses by consuming contaminated food or water. You may also catch a virus by sharing utensils or other personal items with an infected person or by touching a contaminated surface. SYMPTOMS  The most common symptoms are diarrhea and vomiting. These problems can cause a severe loss of body fluids (dehydration) and a body salt (electrolyte) imbalance. Other symptoms may include:  Fever.  Headache.  Fatigue.  Abdominal pain. DIAGNOSIS  Your caregiver can usually diagnose viral gastroenteritis based on your symptoms and a physical exam. A stool sample may also be taken to test for the presence of viruses or other infections. TREATMENT  This illness typically goes away on its own. Treatments are aimed at rehydration. The most serious cases of viral gastroenteritis involve vomiting so severely that you are not able to keep fluids down. In these cases, fluids must be given through an intravenous line (IV). HOME CARE INSTRUCTIONS   Drink enough fluids to keep your urine clear or pale yellow. Drink small amounts of fluids frequently and increase the amounts as tolerated.  Ask your caregiver for specific rehydration instructions.  Avoid:  Foods high in sugar.  Alcohol.  Carbonated drinks.  Tobacco.  Juice.  Caffeine drinks.  Extremely hot or cold fluids.  Fatty, greasy foods.  Too much intake of anything at one time.  Dairy products until 24 to 48 hours after diarrhea stops.  You may consume probiotics.  Probiotics are active cultures of beneficial bacteria. They may lessen the amount and number of diarrheal stools in adults. Probiotics can be found in yogurt with active cultures and in supplements.  Wash your hands well to avoid spreading the virus.  Only take over-the-counter or prescription medicines for pain, discomfort, or fever as directed by your caregiver. Do not give aspirin to children. Antidiarrheal medicines are not recommended.  Ask your caregiver if you should continue to take your regular prescribed and over-the-counter medicines.  Keep all follow-up appointments as directed by your caregiver. SEEK IMMEDIATE MEDICAL CARE IF:   You are unable to keep fluids down.  You do not urinate at least once every 6 to 8 hours.  You develop shortness of breath.  You notice blood in your stool or vomit. This may look like coffee grounds.  You have abdominal pain that increases or is concentrated in one small area (localized).  You have persistent vomiting or diarrhea.  You have a fever.  The patient is a child younger than 3 months, and he or she has a fever.  The patient is a child older than 3 months, and he or she has a fever and persistent symptoms.  The patient is a child older than 3 months, and he or she has a fever and symptoms suddenly get worse.  The patient is a baby, and he or she has no tears when crying. MAKE SURE YOU:   Understand these instructions.  Will watch your condition.  Will get help right away if you are not doing well or get worse. Document Released: 09/29/2005 Document Revised: 12/22/2011 Document Reviewed: 07/16/2011   ExitCare Patient Information 2015 [employer]. This information is not intended to replace advice given to you by your health care provider. Make sure you discuss any questions you have with your health care provider. Clear Liquid Diet A clear liquid diet is a short-term diet that is prescribed to provide the necessary fluid and  basic energy you need when you can have nothing else. The clear liquid diet consists of liquids or solids that will become liquid at room temperature. You should be able to see through the liquid. There are many reasons that you may be restricted to clear liquids, such as:  When you have a sudden-onset (acute) condition that occurs before or after surgery.  To help your body slowly get adjusted to food again after a long period when you were unable to have food.  Replacement of fluids when you have a diarrheal disease.  When you are going to have certain exams, such as a colonoscopy, in which instruments are inserted inside your body to look at parts of your digestive system. WHAT CAN I HAVE? A clear liquid diet does not provide all the nutrients you need. It is important to choose a variety of the following items to get as many nutrients as possible:  Vegetable juices that do not have pulp.  Fruit juices and fruit drinks that do not have pulp.  Coffee (regular or decaffeinated), tea, or soda at the discretion of your health care provider.  Clear bouillon, broth, or strained broth-based soups.  High-protein and flavored gelatins.  Sugar or honey.  Ices or frozen ice pops that do not contain milk. If you are not sure whether you can have certain items, you should ask your health care provider. You may also ask your health care provider if there are any other clear liquid options. Document Released: 09/29/2005 Document Revised: 10/04/2013 Document Reviewed: 08/26/2013 Exit[employer] Patient Information 2015 [employer]. This information is not intended to replace advice given to you by your health care provider. Make sure you discuss any questions you have with your health care provider.  

## 2015-04-02 NOTE — Progress Notes (Signed)
Subjective:  Patient ID: Barry Whitney, male    DOB: 1991-07-22  Age: 24 y.o. MRN: [id number]  CC: Abdominal Pain and Emesis   HPI Barry Whitney presents   With abdominal discomfort he said that he has left lower quadrant abdominal pain it began on Friday been nauseated and vomited several times he's had adequate liquids to drink. He's had loose stools are watery in character. No blood mucus or pus in stool denies any blood in his vomitus. No fever chills. Has no cough or coryza. Full-time and systems were forced take that off today sick. He's had no improvement with over-the-counter medication.  History Barry Whitney has no past medical history on file.   He has no past surgical history on file.   His  family history is not on file.  He   reports that he has never smoked. He has never used smokeless tobacco. He reports that he does not drink alcohol or use illicit drugs.  No outpatient prescriptions prior to visit.   No facility-administered medications prior to visit.    History   Social History  . Marital Status: Single    Spouse Name: N/A  . Number of Children: N/A  . Years of Education: N/A   Occupational History  . labor    Social History Main Topics  . Smoking status: Never Smoker   . Smokeless tobacco: Never Used  . Alcohol Use: No  . Drug Use: No  . Sexual Activity: Not on file   Other Topics Concern  . None   Social History Narrative     Review of Systems  Constitutional: Negative for fever, chills and appetite change.  HENT: Negative for congestion, ear pain, postnasal drip, sinus pressure and sore throat.   Eyes: Negative for pain and redness.  Respiratory: Negative for cough, shortness of breath and wheezing.   Cardiovascular: Negative for leg swelling.  Gastrointestinal: Positive for nausea, vomiting, abdominal pain and diarrhea. Negative for constipation and blood in stool.  Endocrine: Negative for polyuria.  Genitourinary: Negative for  dysuria, urgency, frequency and flank pain.  Musculoskeletal: Negative for gait problem.  Skin: Negative for rash.  Neurological: Negative for weakness and headaches.  Psychiatric/Behavioral: Negative for confusion and decreased concentration. The patient is not nervous/anxious.     Objective:  BP 108/60 mmHg  Pulse 70  Temp(Src) 98.8 F (37.1 C) (Oral)  Resp 16  Ht 6\' 2"  (1.88 m)  Wt 225 lb 12.8 oz (102.422 kg)  BMI 28.98 kg/m2  SpO2 98%  Physical Exam  Constitutional: He is oriented to person, place, and time. He appears well-developed and well-nourished. No distress.  HENT:  Head: Normocephalic and atraumatic.  Right Ear: External ear normal.  Left Ear: External ear normal.  Nose: Nose normal.  Eyes: Conjunctivae and EOM are normal. Pupils are equal, round, and reactive to light. No scleral icterus.  Neck: Normal range of motion. Neck supple. No tracheal deviation present.  Cardiovascular: Normal rate, regular rhythm and normal heart sounds.   Pulmonary/Chest: Effort normal. No respiratory distress. He has no wheezes. He has no rales.  Abdominal: He exhibits no mass. There is no tenderness. There is no rebound and no guarding.  Musculoskeletal: He exhibits no edema.  Lymphadenopathy:    He has no cervical adenopathy.  Neurological: He is alert and oriented to person, place, and time. Coordination normal.  Skin: Skin is warm and dry. No rash noted.  Psychiatric: He has a normal mood and affect. His behavior is normal.  Assessment & Plan:   Barry Whitney was seen today for abdominal pain and emesis.  Diagnoses and all orders for this visit:  Noninfectious gastroenteritis, unspecified Orders: -     POCT CBC  Other orders -     ondansetron (ZOFRAN-ODT) 8 MG disintegrating tablet; Take 1 tablet (8 mg total) by mouth every 8 (eight) hours as needed for nausea. -     loperamide (IMODIUM A-D) 2 MG tablet; 2 now and one hourly prn diarrhea.  Max 8 tabs in 24 hours   I  am having Barry Whitney start on ondansetron and loperamide.  Meds ordered this encounter  Medications  . ondansetron (ZOFRAN-ODT) 8 MG disintegrating tablet    Sig: Take 1 tablet (8 mg total) by mouth every 8 (eight) hours as needed for nausea.    Dispense:  30 tablet    Refill:  0  . loperamide (IMODIUM A-D) 2 MG tablet    Sig: 2 now and one hourly prn diarrhea.  Max 8 tabs in 24 hours    Dispense:  30 tablet    Refill:  0    Appropriate red flag conditions were discussed with the patient as well as actions that should be taken.  Patient expressed his understanding.  Follow-up: Return if symptoms worsen or fail to improve.  Carmelina Dane, MD   Results for orders placed or performed in visit on 04/02/15  POCT CBC  Result Value Ref Range   WBC 4.9 4.6 - 10.2 K/uL   Lymph, poc 2.0 0.6 - 3.4   POC LYMPH PERCENT 40.0 10 - 50 %L   MID (cbc) 0.2 0 - 0.9   POC MID % 3.8 0 - 12 %M   POC Granulocyte 2.8 2 - 6.9   Granulocyte percent 56.2 37 - 80 %G   RBC 4.94 4.69 - 6.13 M/uL   Hemoglobin 14.4 14.1 - 18.1 g/dL   HCT, POC 13.0 86.5 - 53.7 %   MCV 88.5 80 - 97 fL   MCH, POC 29.1 27 - 31.2 pg   MCHC 32.9 31.8 - 35.4 g/dL   RDW, POC 78.4 %   Platelet Count, POC 214 142 - 424 K/uL   MPV 8.2 0 - 99.8 fL

## 2015-09-19 ENCOUNTER — Emergency Department (HOSPITAL_COMMUNITY): Payer: PRIVATE HEALTH INSURANCE

## 2015-09-19 ENCOUNTER — Encounter (HOSPITAL_COMMUNITY): Payer: Self-pay | Admitting: *Deleted

## 2015-09-19 ENCOUNTER — Emergency Department (HOSPITAL_COMMUNITY)
Admission: EM | Admit: 2015-09-19 | Discharge: 2015-09-19 | Disposition: A | Discharge status: Home / Self Care | Payer: PRIVATE HEALTH INSURANCE | Attending: Emergency Medicine | Admitting: Emergency Medicine

## 2015-09-19 DIAGNOSIS — M25512 Pain in left shoulder: Secondary | ICD-10-CM

## 2015-09-19 DIAGNOSIS — S4992XA Unspecified injury of left shoulder and upper arm, initial encounter: Secondary | ICD-10-CM | POA: Diagnosis present

## 2015-09-19 DIAGNOSIS — Y998 Other external cause status: Secondary | ICD-10-CM | POA: Diagnosis not present

## 2015-09-19 DIAGNOSIS — S99922A Unspecified injury of left foot, initial encounter: Secondary | ICD-10-CM | POA: Diagnosis not present

## 2015-09-19 DIAGNOSIS — Y92410 Unspecified street and highway as the place of occurrence of the external cause: Secondary | ICD-10-CM | POA: Diagnosis not present

## 2015-09-19 DIAGNOSIS — S6992XA Unspecified injury of left wrist, hand and finger(s), initial encounter: Secondary | ICD-10-CM | POA: Diagnosis not present

## 2015-09-19 DIAGNOSIS — S3992XA Unspecified injury of lower back, initial encounter: Secondary | ICD-10-CM | POA: Diagnosis not present

## 2015-09-19 DIAGNOSIS — Y9389 Activity, other specified: Secondary | ICD-10-CM | POA: Insufficient documentation

## 2015-09-19 DIAGNOSIS — M79642 Pain in left hand: Secondary | ICD-10-CM

## 2015-09-19 DIAGNOSIS — S99921A Unspecified injury of right foot, initial encounter: Secondary | ICD-10-CM | POA: Insufficient documentation

## 2015-09-19 MED ORDER — NAPROXEN 250 MG PO TABS: 250.0000 mg | ORAL_TABLET | Freq: Two times a day (BID) | ORAL | Status: DC

## 2015-09-19 MED ORDER — METHOCARBAMOL 500 MG PO TABS: 500.0000 mg | ORAL_TABLET | Freq: Two times a day (BID) | ORAL | Status: DC | PRN

## 2015-09-19 NOTE — ED Notes (Signed)
Pt given ice pack for left wrist
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
## 2015-09-19 NOTE — ED Notes (Signed)
Declined W/C at D/C and was escorted to lobby by RN. 

## 2015-10-03 ENCOUNTER — Encounter (HOSPITAL_COMMUNITY): Payer: Self-pay | Admitting: Emergency Medicine

## 2015-10-03 ENCOUNTER — Emergency Department (HOSPITAL_COMMUNITY)
Admission: EM | Admit: 2015-10-03 | Discharge: 2015-10-03 | Disposition: A | Discharge status: Home / Self Care | Payer: PRIVATE HEALTH INSURANCE | Attending: Emergency Medicine | Admitting: Emergency Medicine

## 2015-10-03 ENCOUNTER — Emergency Department (HOSPITAL_COMMUNITY): Payer: PRIVATE HEALTH INSURANCE

## 2015-10-03 DIAGNOSIS — M79642 Pain in left hand: Secondary | ICD-10-CM | POA: Insufficient documentation

## 2015-10-03 DIAGNOSIS — Z791 Long term (current) use of non-steroidal anti-inflammatories (NSAID): Secondary | ICD-10-CM | POA: Insufficient documentation

## 2015-10-03 DIAGNOSIS — R52 Pain, unspecified: Secondary | ICD-10-CM

## 2015-10-03 NOTE — Discharge Instructions (Signed)
Intermetacarpal Sprain °The intermetacarpal ligaments run between the knuckles at the base of the fingers. These ligaments are vulnerable to sprain and injury in which the ligament becomes overstretched or torn. Intermetacarpal sprains are classified into 3 categories. Grade 1 sprains cause pain, but the tendon is not lengthened. Grade 2 sprains include a lengthened ligament, due to the ligament being stretched or partially ruptured. With grade 2 sprains there is still function, although function may be decreased. Grade 3 sprains include a complete tear of the ligament, and the joint usually displays a loss of function.  °SYMPTOMS  °· Severe pain at the time of injury. °· Often, a feeling of popping or tearing inside the hand. °· Tenderness and inflammation at the knuckles. °· Bruising within a couple days of injury. °· Impaired ability to use the hand. °CAUSES  °This condition occurs when the intermetacarpal ligaments are subjected to a greater stress than they can handle. This causes the ligaments to become stretched or torn. °RISK INCREASES WITH: °· Previous hand injury. °· Fighting sports (boxing, wrestling, martial arts). °· Sports in which you could fall on an outstretched hand (soccer, basketball, volleyball). °· Other sports with repeated hand trauma (water polo, gymnastics). °· Poor hand strength and flexibility. °· Inadequate or poorly fitted protective equipment. °PREVENTION  °· Warm up and stretch properly before activity. °· Maintain appropriate conditioning: °¨ Hand flexibility. °¨ Muscle strength and endurance. °· Applying tape, protective strapping, or a brace may help prevent injury. °· Provide the hand with support during sports and practice activities for 6 to 12 months following injury. °PROGNOSIS  °With proper treatment, healing should occur without impairment. The length of healing varies from 2 to 12 weeks, depending on the severity of injury. °RELATED COMPLICATIONS  °· Longer healing time, if  activities are resumed too soon. °· Recurring symptoms or repeated injury, resulting in a chronic problem. °· Injury to other nearby structures (bone, cartilage, tendon). °· Arthritis of the knuckle (intermetacarpal) joint, with repeated sprains. °· Prolonged disability (sometimes). °· Hand and finger stiffness or weakness. °TREATMENT °Treatment first involves ice and medicine to reduce pain and inflammation. An elastic compression bandage may be worn to reduce discomfort and to protect the area. Depending on the severity of injury, you may be required to restrain the area with a cast, splint, or brace. After the ligament has been allowed to heal, strengthening and stretching exercises may be needed to regain strength and a full range of motion. Exercises may be completed at home or with a therapist. Surgery is rarely needed. °MEDICATION  °· If pain medicine is needed, nonsteroidal anti-inflammatory medicines (aspirin and ibuprofen), or other minor pain relievers (acetaminophen), are often advised. °· Do not take pain medicine for 7 days before surgery. °· Stronger pain relievers may be prescribed if your caregiver thinks they are needed. Use only as directed and only as much as you need. °HEAT AND COLD °· Cold treatment (icing) should be applied for 10 to 15 minutes every 2 to 3 hours for inflammation and pain, and immediately after activity that aggravates your symptoms. Use ice packs or an ice massage. °· Heat treatment may be used before performing stretching and strengthening activities prescribed by your caregiver, physical therapist, or athletic trainer. Use a heat pack or a warm water soak. °SEEK MEDICAL CARE IF:  °· Symptoms remain or get worse, despite treatment for longer than 2 to 4 weeks. °· You experience pain, numbness, discoloration, or coldness in the hand or fingers. °·   You develop blue, gray, or dark fingernails. °· Any of the following occur after surgery: increased pain, swelling, redness,  drainage of fluids, bleeding in the affected area, or signs of infection, including fever. °· New, unexplained symptoms develop. (Drugs used in treatment may produce side effects.) °  °This information is not intended to replace advice given to you by your health care provider. Make sure you discuss any questions you have with your health care provider. °  °Document Released: 09/29/2005 Document Revised: 10/20/2014 Document Reviewed: 03/19/2015 °Elsevier Interactive Patient Education ©2016 Elsevier Inc. ° °
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
## 2015-10-03 NOTE — ED Notes (Signed)
Pt to ER with complaint of continued left hand pain. Pt reports being treated with ace wrap and "keeps knocking it." Pt in NAD.

## 2016-07-09 ENCOUNTER — Encounter (HOSPITAL_COMMUNITY): Payer: Self-pay

## 2016-07-09 ENCOUNTER — Emergency Department (HOSPITAL_COMMUNITY)
Admission: EM | Admit: 2016-07-09 | Discharge: 2016-07-09 | Disposition: A | Discharge status: Home / Self Care | Payer: Self-pay | Attending: Dermatology | Admitting: Dermatology

## 2016-07-09 ENCOUNTER — Encounter (HOSPITAL_COMMUNITY): Payer: Self-pay | Admitting: Emergency Medicine

## 2016-07-09 ENCOUNTER — Telehealth: Payer: Self-pay | Admitting: Medical

## 2016-07-09 ENCOUNTER — Emergency Department (HOSPITAL_COMMUNITY)
Admission: EM | Admit: 2016-07-09 | Discharge: 2016-07-09 | Disposition: A | Discharge status: Home / Self Care | Payer: Self-pay | Attending: Emergency Medicine | Admitting: Emergency Medicine

## 2016-07-09 DIAGNOSIS — Z791 Long term (current) use of non-steroidal anti-inflammatories (NSAID): Secondary | ICD-10-CM | POA: Insufficient documentation

## 2016-07-09 DIAGNOSIS — Y929 Unspecified place or not applicable: Secondary | ICD-10-CM | POA: Insufficient documentation

## 2016-07-09 DIAGNOSIS — Y990 Civilian activity done for income or pay: Secondary | ICD-10-CM | POA: Insufficient documentation

## 2016-07-09 DIAGNOSIS — R102 Pelvic and perineal pain: Secondary | ICD-10-CM | POA: Insufficient documentation

## 2016-07-09 DIAGNOSIS — Z79899 Other long term (current) drug therapy: Secondary | ICD-10-CM | POA: Insufficient documentation

## 2016-07-09 DIAGNOSIS — R1032 Left lower quadrant pain: Secondary | ICD-10-CM | POA: Insufficient documentation

## 2016-07-09 DIAGNOSIS — R1030 Lower abdominal pain, unspecified: Secondary | ICD-10-CM | POA: Insufficient documentation

## 2016-07-09 DIAGNOSIS — X500XXA Overexertion from strenuous movement or load, initial encounter: Secondary | ICD-10-CM | POA: Insufficient documentation

## 2016-07-09 DIAGNOSIS — Y9389 Activity, other specified: Secondary | ICD-10-CM | POA: Insufficient documentation

## 2016-07-09 DIAGNOSIS — Z5321 Procedure and treatment not carried out due to patient leaving prior to being seen by health care provider: Secondary | ICD-10-CM | POA: Insufficient documentation

## 2016-07-09 LAB — URINALYSIS, ROUTINE W REFLEX MICROSCOPIC
BILIRUBIN URINE: NEGATIVE
GLUCOSE, UA: NEGATIVE mg/dL
Hgb urine dipstick: NEGATIVE
KETONES UR: NEGATIVE mg/dL
Leukocytes, UA: NEGATIVE
Nitrite: NEGATIVE
PH: 5 (ref 5.0–8.0)
Protein, ur: NEGATIVE mg/dL
SPECIFIC GRAVITY, URINE: 1.024 (ref 1.005–1.030)

## 2016-07-09 MED ORDER — IBUPROFEN 600 MG PO TABS: 600.0000 mg | ORAL_TABLET | Freq: Three times a day (TID) | ORAL | 0 refills | Status: DC

## 2016-07-09 NOTE — Discharge Instructions (Signed)
You can put heat or ice on the affected area for 20 minutes at a time.  Take Ibuprofen 600mg  three times daily for pain.  Rest as much as possible and try not to re-aggravate the area

## 2016-07-09 NOTE — Telephone Encounter (Signed)
Please call pt.  Last visit was 2015, and we either need to get him in or take him off our patient pool.  Please get in touch with the patient about recent visit to the emergency department.  Remind them to use us first for non emergency issues.  The emergency dept is expensive, should be used primarily for emergencies, and they should be encouraged to come here for non emergency problems.  This saves every body money, saves them time, and is better for continuity of care.  If they are ever unsure, they can always call here first, unless after hours or weekends.

## 2016-07-09 NOTE — ED Triage Notes (Signed)
Pt reports right groin pain going to left side. Denies pain in scrotum area. Denies urinary symptoms.

## 2016-07-09 NOTE — ED Triage Notes (Signed)
Pt states that he started to experience pelvic pain on Thursday of last week . States that the pain has lingered and he now feels soreness in his testicles and rectum. Pt denies any recent injury or cause for the pain. A&Ox4. Ambulatory.

## 2016-07-09 NOTE — ED Provider Notes (Signed)
[facility]-EMERGENCY DEPT Provider Note   CSN: [id number] Arrival date & time: 07/09/16  1656  By signing my name below, I, Barry Whitney, attest that this documentation has been prepared under the direction and in the presence of non-physician practitioner, Terance HartKelly Mykell Rawl, PA-C. Electronically Signed: Modena JanskyAlbert Whitney, Scribe. 07/09/2016. 6:06 PM.  History   Chief Complaint Chief Complaint  Patient presents with  . Groin Pain   The history is provided by the patient. No language interpreter was used.   HPI Comments: Barry Whitney is a 25 y.o. male who presents to the Emergency Department complaining of constant moderate right groin pain that started 5 days ago. Pt states his has been worsening and radiating to the left groin area. He reports his job is very physical with lots of heavy lifting and walking. He took a couple days off of work after he had the initial pain and it got better but when he went back to work the pain got worse again. Denies any fever, chills, abdominal pain, nausea, vomiting, dysuria, discharge, testicular pain, hx of STD.  History reviewed. No pertinent past medical history.  There are no active problems to display for this patient.   History reviewed. No pertinent surgical history.     Home Medications    Prior to Admission medications   Medication Sig Start Date End Date Taking? Authorizing Provider  loperamide (IMODIUM A-D) 2 MG tablet 2 now and one hourly prn diarrhea.  Max 8 tabs in 24 hours 04/02/15   Carmelina DaneJeffery S Anderson, MD  methocarbamol (ROBAXIN) 500 MG tablet Take 1 tablet (500 mg total) by mouth 2 (two) times daily as needed for muscle spasms. 09/19/15   Everlene FarrierWilliam Dansie, PA-C  naproxen (NAPROSYN) 250 MG tablet Take 1 tablet (250 mg total) by mouth 2 (two) times daily with a meal. 09/19/15   Everlene FarrierWilliam Dansie, PA-C  ondansetron (ZOFRAN-ODT) 8 MG disintegrating tablet Take 1 tablet (8 mg total) by mouth every 8 (eight) hours as needed for nausea. 04/02/15   Carmelina DaneJeffery  S Anderson, MD    Family History No family history on file.  Social History Social History  Substance Use Topics  . Smoking status: Never Smoker  . Smokeless tobacco: Never Used  . Alcohol use No     Allergies   Review of patient's allergies indicates no known allergies.   Review of Systems Review of Systems  Constitutional: Negative for chills and fever.  Gastrointestinal: Negative for abdominal pain, nausea and vomiting.  Genitourinary: Negative for discharge, dysuria, frequency, scrotal swelling and testicular pain.  Musculoskeletal: Positive for myalgias (Right groin).  All other systems reviewed and are negative.    Physical Exam Updated Vital Signs BP 121/91 (BP Location: Right Arm)   Pulse 83   Temp 99.3 F (37.4 C) (Oral)   Resp 18   SpO2 98%   Physical Exam  Constitutional: He is oriented to person, place, and time. He appears well-developed and well-nourished. No distress.  HENT:  Head: Normocephalic and atraumatic.  Eyes: Conjunctivae are normal. Pupils are equal, round, and reactive to light. Right eye exhibits no discharge. Left eye exhibits no discharge. No scleral icterus.  Neck: Normal range of motion. Neck supple.  Cardiovascular: Normal rate and regular rhythm.   No murmur heard. Pulmonary/Chest: Effort normal and breath sounds normal. No respiratory distress.  Abdominal: Soft. He exhibits no distension. There is no tenderness.  Genitourinary:  Genitourinary Comments: No inguinal lymphadenopathy or inguinal hernia noted. Tenderness over R inguinal area. Normal circumcised penis  free of lesions or rash. Testicles are nontender with normal lie. Normal scrotal appearance. No obvious discharge noted. Chaperone present during exam.    Musculoskeletal: He exhibits no edema.  Neurological: He is alert and oriented to person, place, and time.  Skin: Skin is warm and dry.  Psychiatric: He has a normal mood and affect.  Nursing note and vitals  reviewed.    ED Treatments / Results  DIAGNOSTIC STUDIES: Oxygen Saturation is 98% on RA, normal by my interpretation.    COORDINATION OF CARE: 6:10 PM- Pt advised of plan for treatment and pt agrees.  Labs (all labs ordered are listed, but only abnormal results are displayed) Labs Reviewed  URINALYSIS, ROUTINE W REFLEX MICROSCOPIC (NOT AT [facility])  GC/CHLAMYDIA PROBE AMP ([place]) NOT AT [facility]    EKG  EKG Interpretation None       Radiology No results found.  Procedures Procedures (including critical care time)  Medications Ordered in ED Medications - No data to display   Initial Impression / Assessment and Plan / ED Course  I have reviewed the triage vital signs and the nursing notes.  Pertinent labs & imaging results that were available during my care of the patient were reviewed by me and considered in my medical decision making (see chart for details).  Clinical Course   25 year old male presents with symptoms consistent with groin strain. UA is clean. No testicular pain on exam. Will tx with course of NSAIDs. Patient is NAD, non-toxic, with stable VS. Patient is informed of clinical course, understands medical decision making process, and agrees with plan. Opportunity for questions provided and all questions answered. Return precautions given.   Final Clinical Impressions(s) / ED Diagnoses   Final diagnoses:  Left groin pain    New Prescriptions Discharge Medication List as of 07/09/2016  6:57 PM    START taking these medications   Details  ibuprofen (ADVIL,MOTRIN) 600 MG tablet Take 1 tablet (600 mg total) by mouth 3 (three) times daily., Starting Wed 07/09/2016, Print       I personally performed the services described in this documentation, which was scribed in my presence. The recorded information has been reviewed and is accurate.     [name], PA-C 07/13/16 1712    [name], MD 07/14/16 [phone]

## 2016-07-09 NOTE — ED Notes (Signed)
Discharge instructions, follow up care, and rx x1 reviewed with patient. Patient verbalized understanding. 

## 2016-07-10 LAB — GC/CHLAMYDIA PROBE AMP (~~LOC~~) NOT AT ARMC
CHLAMYDIA, DNA PROBE: NEGATIVE
NEISSERIA GONORRHEA: NEGATIVE

## 2016-07-18 NOTE — Telephone Encounter (Signed)
Forwarding to diana  

## 2016-08-16 ENCOUNTER — Encounter (HOSPITAL_COMMUNITY): Payer: Self-pay

## 2016-08-16 ENCOUNTER — Emergency Department (HOSPITAL_COMMUNITY)
Admission: EM | Admit: 2016-08-16 | Discharge: 2016-08-17 | Disposition: A | Discharge status: Home / Self Care | Payer: Self-pay | Attending: Emergency Medicine | Admitting: Emergency Medicine

## 2016-08-16 DIAGNOSIS — J069 Acute upper respiratory infection, unspecified: Secondary | ICD-10-CM | POA: Insufficient documentation

## 2016-08-16 NOTE — ED Provider Notes (Signed)
[facility]-EMERGENCY DEPT Provider Note   CSN: [id number] Arrival date & time: 08/16/16  2249   By signing my name below, I, Clarisse GougeXavier Herndon, attest that this documentation has been prepared under the direction and in the presence of Buel ReamAlexandra Charisma Charlot, PA-C. Electronically Signed: Clarisse GougeXavier Herndon, Scribe. 08/17/16. 1:34 AM.   History   Chief Complaint Chief Complaint  Patient presents with  . Influenza    The history is provided by the patient. No language interpreter was used.   HPI Comments: Barry Whitney is a 25 y.o. male who presents to the Emergency Department complaining of gradual onset, gradual improving flu like symptoms x 5 days. Pt reports associated dry cough, fever (tmax 102), chills, occasional shortness of breath, nasal congestion, sinus pressure, ear pain that has now subsided, and sore throat that has also now subsided. [facility], and he states that he last experienced a fever 1-2 days ago. He feels his symptoms are beginning to resolve but has been out of work all week. Pt denies chest pain, Hx of asthma, nausea, vomiting, chest pain, or abdominal pain.   History reviewed. No pertinent past medical history.  There are no active problems to display for this patient.   History reviewed. No pertinent surgical history.     Home Medications    Prior to Admission medications   Medication Sig Start Date End Date Taking? Authorizing Provider  dextromethorphan-guaiFENesin (MUCINEX DM) 30-600 MG 12hr tablet Take 1 tablet by mouth 2 (two) times daily as needed for cough. 08/17/16   Emi HolesAlexandra M Jermiyah Ricotta, PA-C  ibuprofen (ADVIL,MOTRIN) 600 MG tablet Take 1 tablet (600 mg total) by mouth 3 (three) times daily. 07/09/16   Bethel BornKelly Marie Gekas, PA-C  loperamide (IMODIUM A-D) 2 MG tablet 2 now and one hourly prn diarrhea.  Max 8 tabs in 24 hours 04/02/15   Carmelina DaneJeffery S Anderson, MD  methocarbamol (ROBAXIN) 500 MG tablet Take 1 tablet (500 mg total) by mouth 2 (two)  times daily as needed for muscle spasms. 09/19/15   Everlene FarrierWilliam Dansie, PA-C  naproxen (NAPROSYN) 250 MG tablet Take 1 tablet (250 mg total) by mouth 2 (two) times daily with a meal. 09/19/15   Everlene FarrierWilliam Dansie, PA-C  ondansetron (ZOFRAN-ODT) 8 MG disintegrating tablet Take 1 tablet (8 mg total) by mouth every 8 (eight) hours as needed for nausea. 04/02/15   Carmelina DaneJeffery S Anderson, MD    Family History No family history on file.  Social History Social History  Substance Use Topics  . Smoking status: Never Smoker  . Smokeless tobacco: Never Used  . Alcohol use No     Allergies   Review of patient's allergies indicates no known allergies.   Review of Systems Review of Systems  Constitutional: Positive for chills, fatigue and fever.  HENT: Positive for congestion, ear pain, sinus pressure and sore throat. Negative for facial swelling.   Respiratory: Positive for cough and shortness of breath.   Cardiovascular: Negative for chest pain.  Gastrointestinal: Negative for abdominal pain, nausea and vomiting.  Genitourinary: Negative for dysuria.  Musculoskeletal: Negative for back pain.  Skin: Negative for rash and wound.  Neurological: Negative for headaches.  Psychiatric/Behavioral: The patient is not nervous/anxious.      Physical Exam Updated Vital Signs BP 121/72 (BP Location: Left Arm)   Pulse 70   Temp 98.1 F (36.7 C) (Oral)   Resp 16   Ht 6' (1.829 m)   Wt 104.3 kg   SpO2 98%   BMI 31.19  kg/m   Physical Exam  Constitutional: He appears well-developed and well-nourished. No distress.  HENT:  Head: Normocephalic and atraumatic.  Right Ear: Tympanic membrane normal.  Mouth/Throat: Oropharynx is clear and moist. No oropharyngeal exudate.  Eyes: Conjunctivae are normal. Pupils are equal, round, and reactive to light. Right eye exhibits no discharge. Left eye exhibits no discharge. No scleral icterus.  Neck: Normal range of motion. Neck supple. No thyromegaly present.    Cardiovascular: Normal rate, regular rhythm, normal heart sounds and intact distal pulses.  Exam reveals no gallop and no friction rub.   No murmur heard. Pulmonary/Chest: Effort normal. No stridor. No respiratory distress. He has decreased breath sounds (right lung). He has no wheezes. He has no rales.  Abdominal: Soft. Bowel sounds are normal. He exhibits no distension. There is no tenderness. There is no rebound and no guarding.  Musculoskeletal: He exhibits no edema.  Lymphadenopathy:    He has no cervical adenopathy.  Neurological: He is alert. Coordination normal.  Skin: Skin is warm and dry. No rash noted. He is not diaphoretic. No pallor.  Psychiatric: He has a normal mood and affect.  Nursing note and vitals reviewed.    ED Treatments / Results  DIAGNOSTIC STUDIES: Oxygen Saturation is 95% on RA, normal by my interpretation.    COORDINATION OF CARE: 1:34 AM Discussed treatment plan with pt at bedside and pt agreed to plan.   Labs (all labs ordered are listed, but only abnormal results are displayed) Labs Reviewed - No data to display  EKG  EKG Interpretation None       Radiology Dg Chest 2 View  Result Date: 08/17/2016 CLINICAL DATA:  Cough and dyspnea EXAM: CHEST  2 VIEW COMPARISON:  07/26/2014 FINDINGS: The lungs are clear. The pulmonary vasculature is normal. Heart size is normal. Hilar and mediastinal contours are unremarkable. There is no pleural effusion. IMPRESSION: No active cardiopulmonary disease. Electronically Signed   By: Ellery Plunkaniel R Mitchell M.D.   On: 08/17/2016 01:22    Procedures Procedures (including critical care time)  Medications Ordered in ED Medications  albuterol (PROVENTIL HFA;VENTOLIN HFA) 108 (90 Base) MCG/ACT inhaler 1-2 puff (not administered)     Initial Impression / Assessment and Plan / ED Course  I have reviewed the triage vital signs and the nursing notes.  Pertinent labs & imaging results that were available during my care  of the patient were reviewed by me and considered in my medical decision making (see chart for details).  Clinical Course    Pt symptoms consistent with URI. CXR negative for acute infiltrate. Pt will be discharged with symptomatic treatment including inhaler and Mucinex.  Patient advised to follow up with PCP if symptoms are not improving in 5-7 days. Discussed return precautions.  Pt is hemodynamically stable & in NAD prior to discharge.   Final Clinical Impressions(s) / ED Diagnoses   Final diagnoses:  Upper respiratory tract infection, unspecified type    New Prescriptions New Prescriptions   DEXTROMETHORPHAN-GUAIFENESIN (MUCINEX DM) 30-600 MG 12HR TABLET    Take 1 tablet by mouth 2 (two) times daily as needed for cough.  I personally performed the services described in this documentation, which was scribed in my presence. The recorded information has been reviewed and is accurate.    Emi Holeslexandra M Abrian Hanover, PA-C 08/17/16 0135    Shaune Pollackameron Isaacs, MD 08/17/16 [phone]

## 2016-08-16 NOTE — ED Triage Notes (Signed)
Pt states that he has been having flu like symptoms since Tues, fever, chills, fatigue, cough, denies n/v.

## 2016-08-17 ENCOUNTER — Emergency Department (HOSPITAL_COMMUNITY): Payer: Self-pay

## 2016-08-17 MED ORDER — DM-GUAIFENESIN ER 30-600 MG PO TB12: 1.0000 | ORAL_TABLET | Freq: Two times a day (BID) | ORAL | 0 refills | Status: DC | PRN

## 2016-08-17 MED ORDER — ALBUTEROL SULFATE HFA 108 (90 BASE) MCG/ACT IN AERS
1.0000 | INHALATION_SPRAY | Freq: Once | RESPIRATORY_TRACT | Status: AC
  Administered 2016-08-17: 2 via RESPIRATORY_TRACT
  Filled 2016-08-17: qty 6.7

## 2016-08-17 NOTE — ED Notes (Signed)
Patient transported to X-ray 

## 2016-08-17 NOTE — Discharge Instructions (Signed)
Medications: albuterol inhaler, Mucinex  Treatment: Use inhaler every 4-6 hours as needed for shortness of breath or wheezing. Take Mucinex as prescribed for cough.   Follow-up: Please follow up with your doctor as needed if your symptoms are not improving over the next 7 days. Please return to the emergency department if you develop any new or worsening symptoms.

## 2016-08-17 NOTE — ED Notes (Signed)
Pt departed in NAD.  

## 2016-08-18 ENCOUNTER — Telehealth: Payer: Self-pay | Admitting: Medical

## 2016-08-18 NOTE — Telephone Encounter (Signed)
Removed pcp

## 2016-08-18 NOTE — Telephone Encounter (Signed)
Please get in touch with the patient about recent visit to the emergency department.  Remind them to use us first for non emergency issues.  The emergency dept is expensive, should be used primarily for emergencies, and they should be encouraged to come here for non emergency problems.  This saves every body money, saves them time, and is better for continuity of care.  If they are ever unsure, they can always call here first, unless after hours or weekends.    Please remove me as PCP

## 2017-02-07 ENCOUNTER — Encounter (HOSPITAL_COMMUNITY): Payer: Self-pay | Admitting: Emergency Medicine

## 2017-02-07 ENCOUNTER — Emergency Department (HOSPITAL_COMMUNITY)
Admission: EM | Admit: 2017-02-07 | Discharge: 2017-02-07 | Disposition: A | Discharge status: Home / Self Care | Payer: Managed Care, Other (non HMO) | Attending: Emergency Medicine | Admitting: Emergency Medicine

## 2017-02-07 ENCOUNTER — Emergency Department (HOSPITAL_COMMUNITY): Payer: Managed Care, Other (non HMO)

## 2017-02-07 DIAGNOSIS — R109 Unspecified abdominal pain: Secondary | ICD-10-CM

## 2017-02-07 DIAGNOSIS — N50811 Right testicular pain: Secondary | ICD-10-CM | POA: Diagnosis not present

## 2017-02-07 DIAGNOSIS — Z79899 Other long term (current) drug therapy: Secondary | ICD-10-CM | POA: Insufficient documentation

## 2017-02-07 LAB — URINALYSIS, ROUTINE W REFLEX MICROSCOPIC
BILIRUBIN URINE: NEGATIVE
GLUCOSE, UA: NEGATIVE mg/dL
Hgb urine dipstick: NEGATIVE
Ketones, ur: NEGATIVE mg/dL
LEUKOCYTES UA: NEGATIVE
NITRITE: NEGATIVE
PH: 5 (ref 5.0–8.0)
Protein, ur: NEGATIVE mg/dL
SPECIFIC GRAVITY, URINE: 1.021 (ref 1.005–1.030)

## 2017-02-07 LAB — I-STAT CHEM 8, ED
BUN: 16 mg/dL (ref 6–20)
CREATININE: 1.2 mg/dL (ref 0.61–1.24)
Calcium, Ion: 1.18 mmol/L (ref 1.15–1.40)
Chloride: 104 mmol/L (ref 101–111)
GLUCOSE: 91 mg/dL (ref 65–99)
HEMATOCRIT: 42 % (ref 39.0–52.0)
Hemoglobin: 14.3 g/dL (ref 13.0–17.0)
POTASSIUM: 4.3 mmol/L (ref 3.5–5.1)
Sodium: 140 mmol/L (ref 135–145)
TCO2: 28 mmol/L (ref 0–100)

## 2017-02-07 NOTE — ED Provider Notes (Signed)
[facility] Provider Note   CSN: [id number] Arrival date & time: 02/07/17  1542     History   Chief Complaint Chief Complaint  Patient presents with  . Flank Pain    HPI Barry Whitney is a 26 y.o. male.  Patient with no pertinent PMH presents to the ED with a chief complaint of right flank pain.  He states that the symptoms are intermittent and that he has been having them for months.  He states that when the pain comes, it is severe 9/10.  He denies any fevers, chills, nausea, or vomiting.  He denies any dysuria, or penile discharge.  He denies any new sexual partners.  He states that the pain radiates to his scrotum, but denies any new masses or lesions of his testicles.  He has not taken anything for his symptoms.  There are no modifying factors.   The history is provided by the patient. No language interpreter was used.    History reviewed. No pertinent past medical history.  There are no active problems to display for this patient.   History reviewed. No pertinent surgical history.     Home Medications    Prior to Admission medications   Medication Sig Start Date End Date Taking? Authorizing Provider  dextromethorphan-guaiFENesin (MUCINEX DM) 30-600 MG 12hr tablet Take 1 tablet by mouth 2 (two) times daily as needed for cough. 08/17/16   Emi Holes, PA-C  ibuprofen (ADVIL,MOTRIN) 600 MG tablet Take 1 tablet (600 mg total) by mouth 3 (three) times daily. 07/09/16   [name], PA-C  loperamide (IMODIUM A-D) 2 MG tablet 2 now and one hourly prn diarrhea.  Max 8 tabs in 24 hours 04/02/15   Carmelina Dane, MD  methocarbamol (ROBAXIN) 500 MG tablet Take 1 tablet (500 mg total) by mouth 2 (two) times daily as needed for muscle spasms. 09/19/15   Everlene Farrier, PA-C  naproxen (NAPROSYN) 250 MG tablet Take 1 tablet (250 mg total) by mouth 2 (two) times daily with a meal. 09/19/15   Everlene Farrier, PA-C  ondansetron (ZOFRAN-ODT) 8 MG disintegrating tablet  Take 1 tablet (8 mg total) by mouth every 8 (eight) hours as needed for nausea. 04/02/15   Carmelina Dane, MD    Family History No family history on file.  Social History Social History  Substance Use Topics  . Smoking status: Never Smoker  . Smokeless tobacco: Never Used  . Alcohol use No     Allergies   Patient has no known allergies.   Review of Systems Review of Systems  All other systems reviewed and are negative.    Physical Exam Updated Vital Signs BP 127/69 (BP Location: Left Arm)   Pulse 68   Temp 98.7 F (37.1 C) (Oral)   Resp 19   Ht  (1.905 m)   Wt 106.6 kg   SpO2 96%   BMI 29.37 kg/m   Physical Exam  Constitutional: He is oriented to person, place, and time. He appears well-developed and well-nourished.  HENT:  Head: Normocephalic and atraumatic.  Eyes: Conjunctivae and EOM are normal. Pupils are equal, round, and reactive to light. Right eye exhibits no discharge. Left eye exhibits no discharge. No scleral icterus.  Neck: Normal range of motion. Neck supple. No JVD present.  Cardiovascular: Normal rate, regular rhythm and normal heart sounds.  Exam reveals no gallop and no friction rub.   No murmur heard. Pulmonary/Chest: Effort normal and breath sounds normal. No respiratory distress.  He has no wheezes. He has no rales. He exhibits no tenderness.  Abdominal: Soft. He exhibits no distension and no mass. There is no tenderness. There is no rebound and no guarding.  No focal abdominal tenderness, no RLQ tenderness or pain at McBurney's point, no RUQ tenderness or Murphy's sign, no left-sided abdominal tenderness, no fluid wave, or signs of peritonitis   Genitourinary:  Genitourinary Comments: No abnormality noted No tenderness on exam No discharge   Musculoskeletal: Normal range of motion. He exhibits no edema or tenderness.  Neurological: He is alert and oriented to person, place, and time.  Skin: Skin is warm and dry.  Psychiatric: He has  a normal mood and affect. His behavior is normal. Judgment and thought content normal.  Nursing note and vitals reviewed.    ED Treatments / Results  Labs (all labs ordered are listed, but only abnormal results are displayed) Labs Reviewed  URINALYSIS, ROUTINE W REFLEX MICROSCOPIC - Abnormal; Notable for the following:       Result Value   APPearance HAZY (*)    All other components within normal limits  I-STAT CHEM 8, ED    EKG  EKG Interpretation None       Radiology No results found.  Procedures Procedures (including critical care time)  Medications Ordered in ED Medications - No data to display   Initial Impression / Assessment and Plan / ED Course  I have reviewed the triage vital signs and the nursing notes.  Pertinent labs & imaging results that were available during my care of the patient were reviewed by me and considered in my medical decision making (see chart for details).     Patient with right flank pain.  Symptoms wax and wane and have been present for several months.  I think that kidney stone is less likely.  His abdomen is soft.  UA is clear.  I discussed that I didn't think there was any emergent issue at this time given the ongoing nature of the symptoms, but discussed that I would need to check a CT scan to completely rule out [place], which is patient's primary concern.  After discussing the risks and benefits of the CT, patient would like to have this done now.  If negative, will plan for outpatient workup.  GU exam is benign.  7:12 PM Patient reassessed after negative CT scan. He states that he feels like his right testicle is throbbing and feels like it is "riding up." I have a low suspicion of torsion, however torsion detorsion is not ruled out. Will verify Doppler flow with ultrasound. If negative, discharged home with primary care follow-up.  [place] negative.   At discharge patient states that he would really just like a note for work.  Final  Clinical Impressions(s) / ED Diagnoses   Final diagnoses:  Right flank pain  Pain in right testicle    New Prescriptions Discharge Medication List as of 02/07/2017  8:31 PM       [name], PA-C 02/08/17 0013    Rolland Porter, MD 02/23/17 [phone]

## 2017-02-07 NOTE — ED Notes (Signed)
Patient transported to Ultrasound 

## 2017-02-07 NOTE — ED Triage Notes (Signed)
Pt states "I think I have a kidney stone, im having major pain in my right kidney area, and my groin area" Pt states pain is 9/10. Pt does not appear in any distress. Pt denies issues with urination.

## 2017-02-07 NOTE — Discharge Instructions (Signed)
Your CT scan and ultrasound show no reason for your pain.  There is no apparent reason for hospital admission.  You will need to follow-up with a primary care doctor.  Please contact the clinic listed below.

## 2017-07-01 ENCOUNTER — Emergency Department (HOSPITAL_COMMUNITY)
Admission: EM | Admit: 2017-07-01 | Discharge: 2017-07-01 | Disposition: A | Discharge status: Home / Self Care | Payer: Managed Care, Other (non HMO) | Attending: Emergency Medicine | Admitting: Emergency Medicine

## 2017-07-01 ENCOUNTER — Encounter (HOSPITAL_COMMUNITY): Payer: Self-pay | Admitting: Emergency Medicine

## 2017-07-01 DIAGNOSIS — M549 Dorsalgia, unspecified: Secondary | ICD-10-CM | POA: Diagnosis not present

## 2017-07-01 DIAGNOSIS — N50819 Testicular pain, unspecified: Secondary | ICD-10-CM | POA: Diagnosis not present

## 2017-07-01 DIAGNOSIS — Z5321 Procedure and treatment not carried out due to patient leaving prior to being seen by health care provider: Secondary | ICD-10-CM | POA: Insufficient documentation

## 2017-07-01 NOTE — ED Triage Notes (Signed)
Patient c/o right lower back pain that has been on going for months. Patient reports that he has found "two stones on each testicle".  Patient clarified knot on each testicle. Right side has been few months but just found one on left side this morning. patient denies any urinary problems.

## 2017-07-01 NOTE — ED Notes (Signed)
No answer from lobby  

## 2017-07-06 ENCOUNTER — Ambulatory Visit: Payer: PRIVATE HEALTH INSURANCE | Admitting: Medical

## 2017-07-30 ENCOUNTER — Ambulatory Visit: Payer: PRIVATE HEALTH INSURANCE | Admitting: Medical

## 2017-08-05 IMAGING — US US ART/VEN ABD/PELV/SCROTUM DOPPLER LTD
1 series · 14 of 25 positions shown · non-contrast
Comparison: None.

CLINICAL DATA: Bilateral groin pain and right testicular pain for 1
month, no known injury, initial encounter

EXAM:
SCROTAL ULTRASOUND
DOPPLER ULTRASOUND OF THE TESTICLES
TECHNIQUE: Complete ultrasound examination of the testicles, epididymis, and
other scrotal structures was performed. Color and spectral Doppler
ultrasound were also utilized to evaluate blood flow to the
testicles.

[Series 1: us art/ven abd/pelv/scrotum doppler ltd · 0.06mm/px · 14 of 47 slices shown]
[im 1/47]
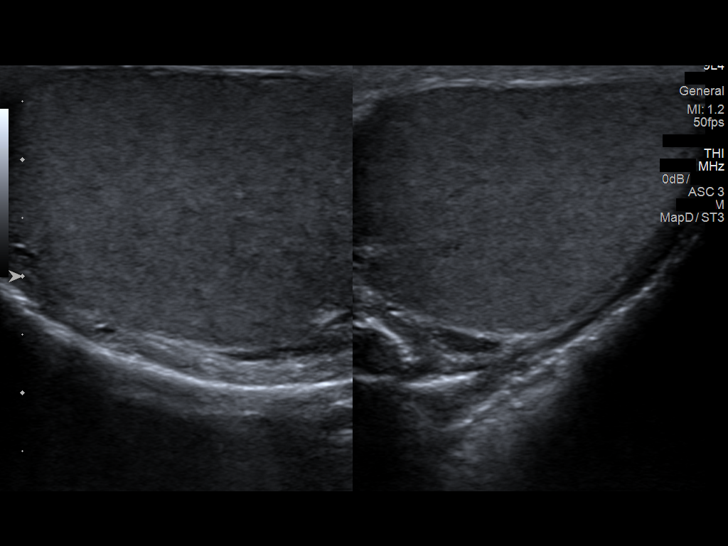
[im 4/47]
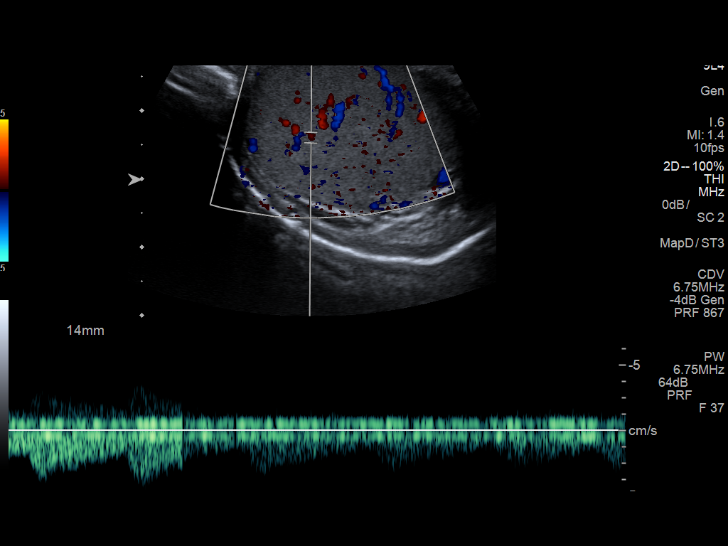
[im 8/47]
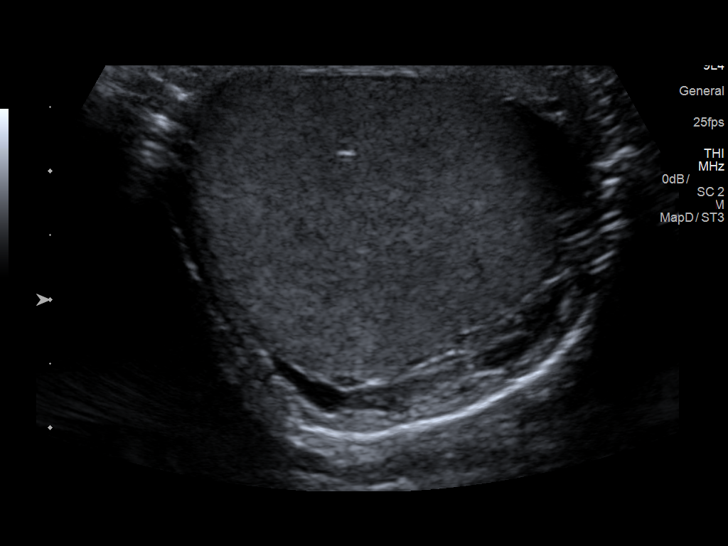
[im 12/47]
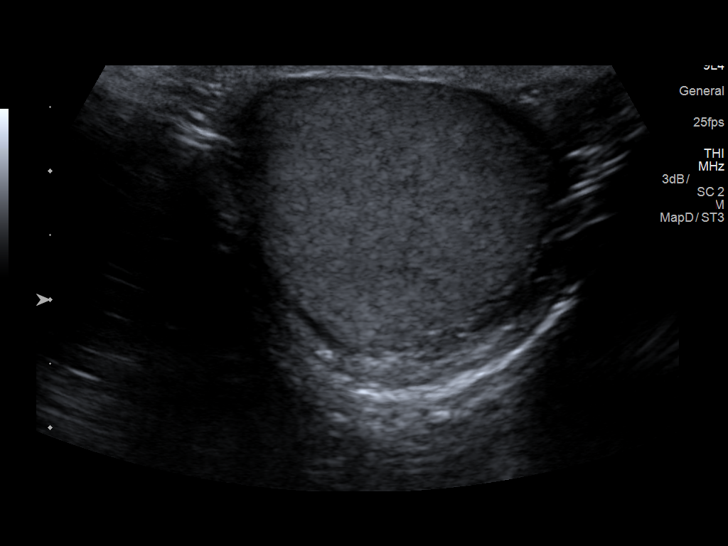
[im 16/47]
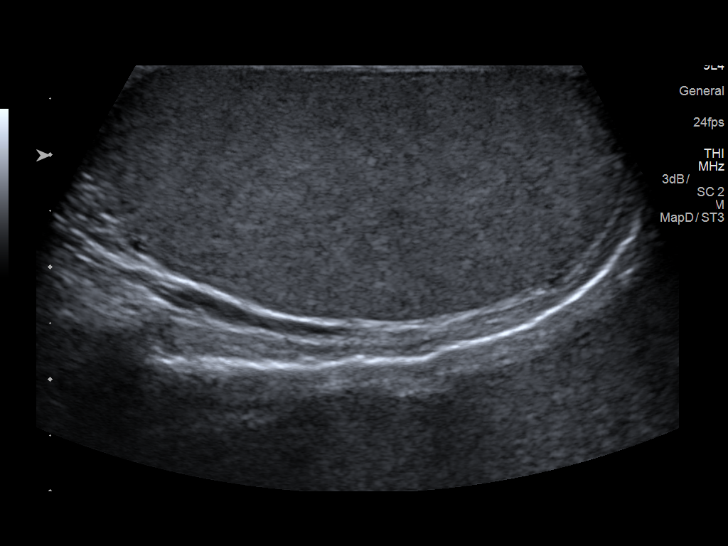
[im 18/47]
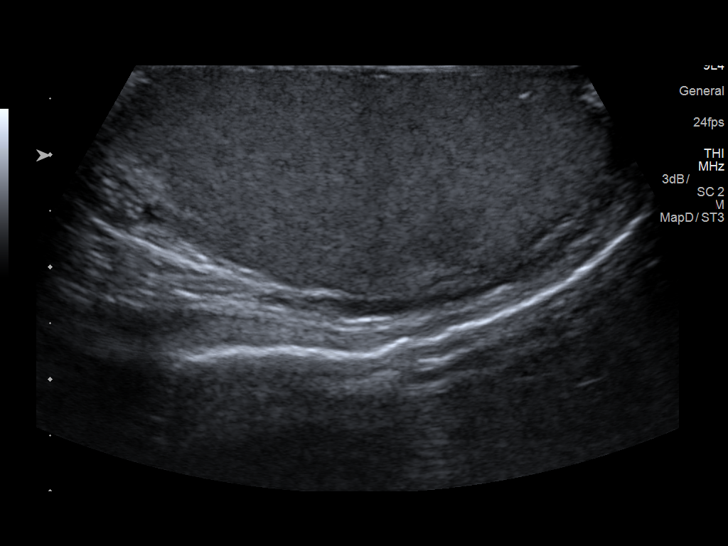
[im 22/47]
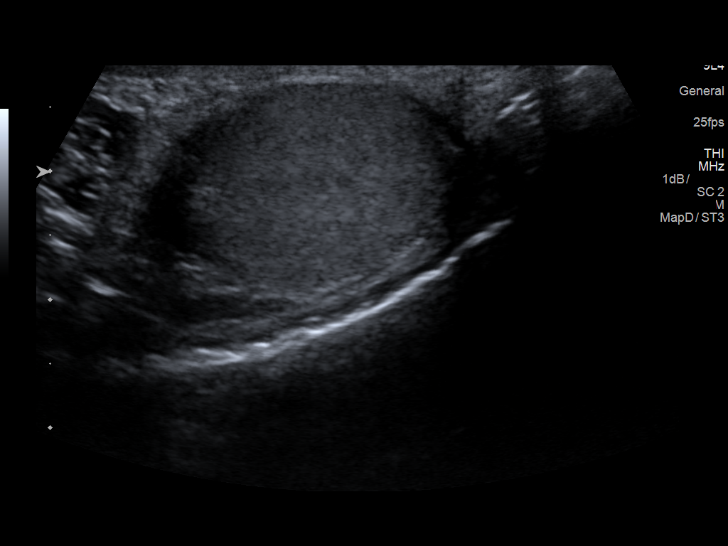
[im 25/47]
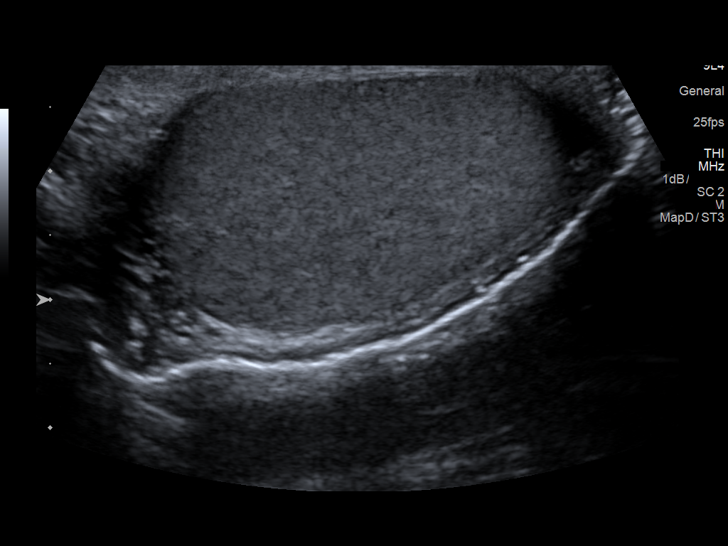
[im 29/47]
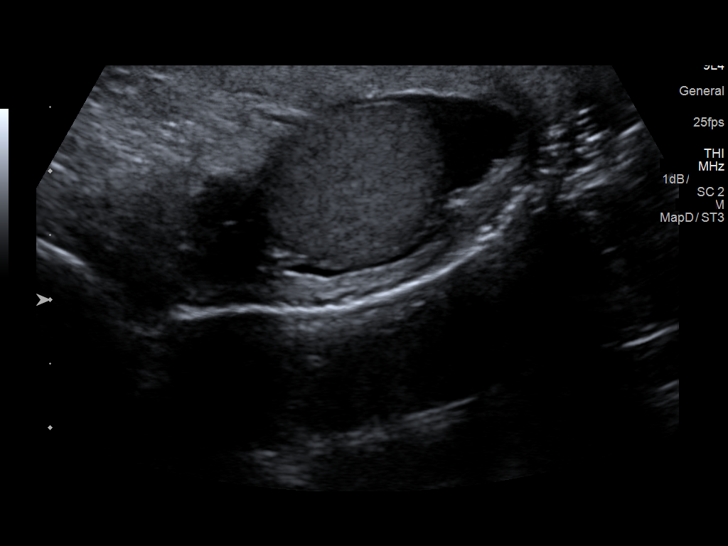
[im 31/47]
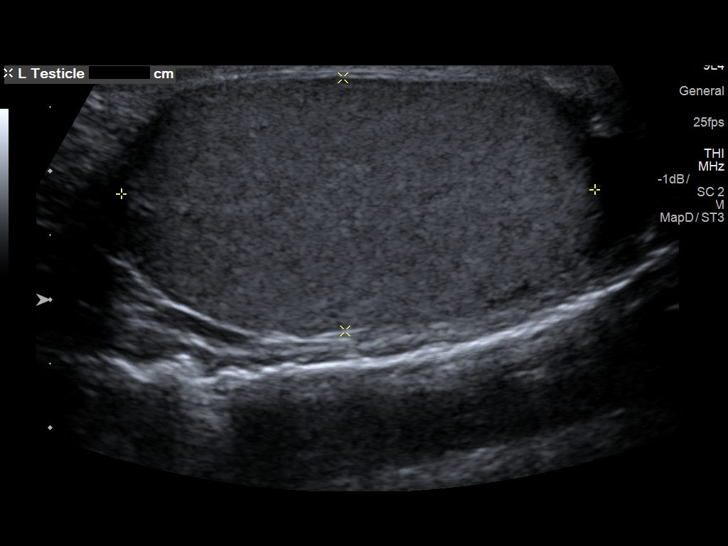
[im 35/47]
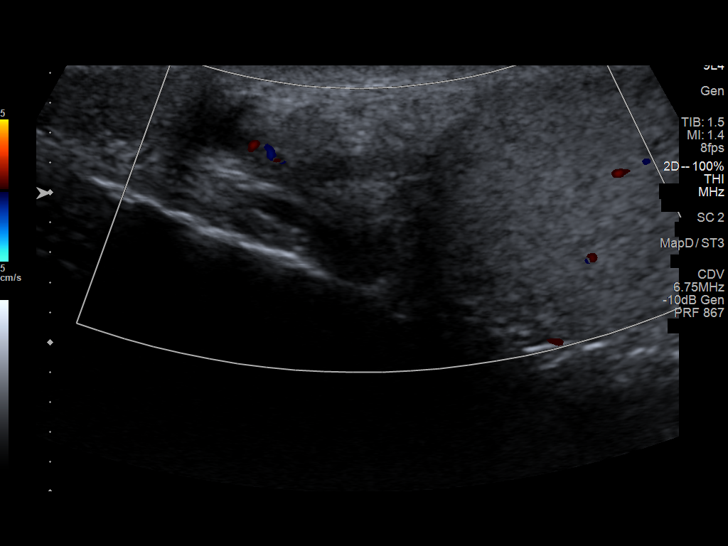
[im 39/47]
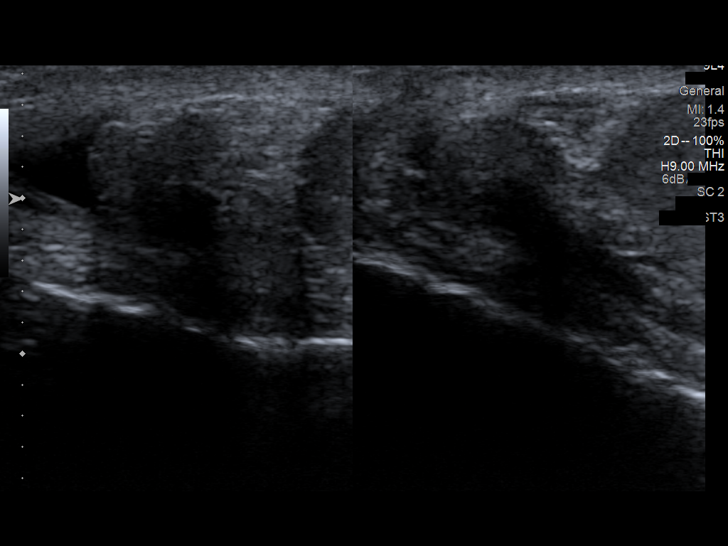
[im 43/47]
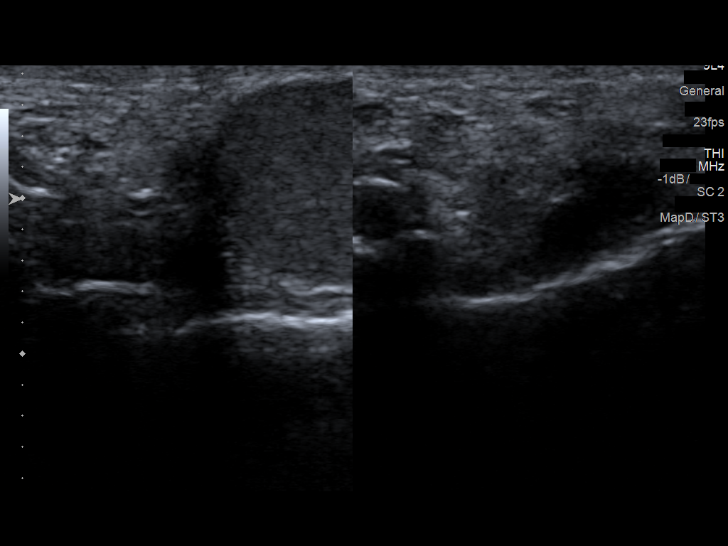
[im 47/47]
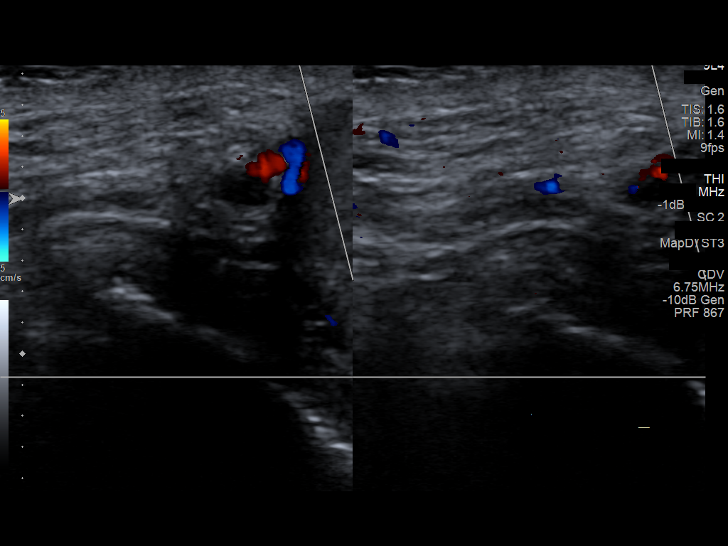

[14 of 25 positions shown; findings below may reference images not displayed]

FINDINGS: Right testicle

Measurements: 4.8 x 2.3 x 3.1 cm.. Few tiny testicular
calcifications are noted. No mass is seen.

Left testicle

Measurements: 3.7 x 2.0 x 3.2 cm.. Few tiny testicular
calcifications are noted. No masses seen.

Right epididymis:  Normal in size and appearance.

Left epididymis:  Normal in size and appearance.

Hydrocele:  None visualized.

Varicocele:  None visualized.

Pulsed Doppler interrogation of both testes demonstrates normal low
resistance arterial and venous waveforms bilaterally.
IMPRESSION: Tiny testicular calcifications.  No acute abnormality is noted.

## 2018-09-02 IMAGING — CT CT RENAL STONE PROTOCOL
2 of 4 series · 16 of 46 positions shown, 18 images · non-contrast
Comparison: Radiography 07/26/2014

CLINICAL DATA: Bilateral flank pain for the last several days,
worse on the right.

EXAM:
CT ABDOMEN AND PELVIS WITHOUT CONTRAST
TECHNIQUE: Multidetector CT imaging of the abdomen and pelvis was performed
following the standard protocol without IV contrast.

[Series 3: renal stone 5.0 · axial · 0.75mm/px · z∈[+1353,+1813]mm · 13 of 100 slices shown, 15 images]
[im 4/100  soft-tissue]
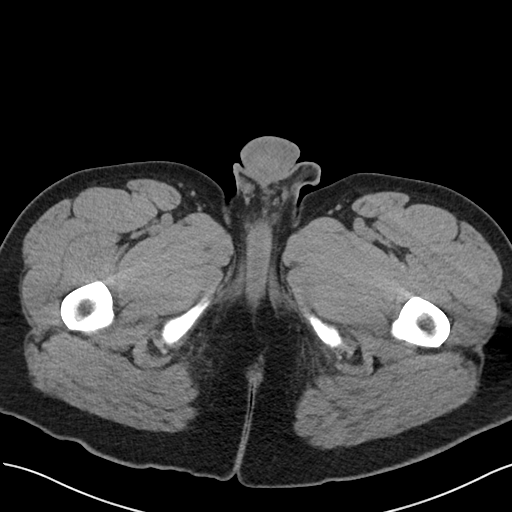
[im 4/100  bone]
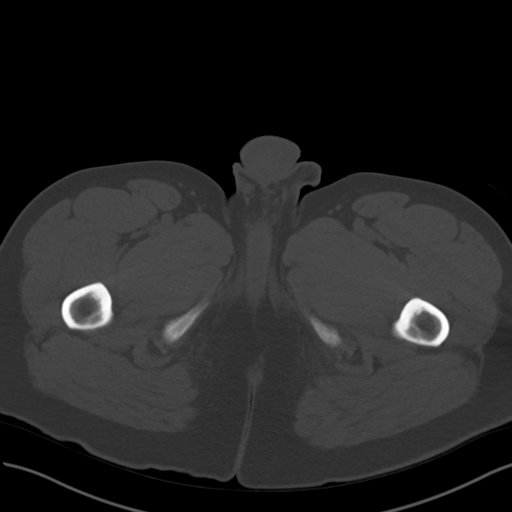
[im 12/100  soft-tissue]
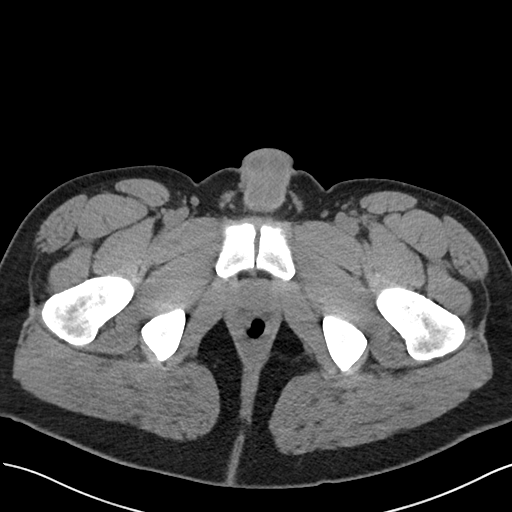
[im 20/100  soft-tissue]
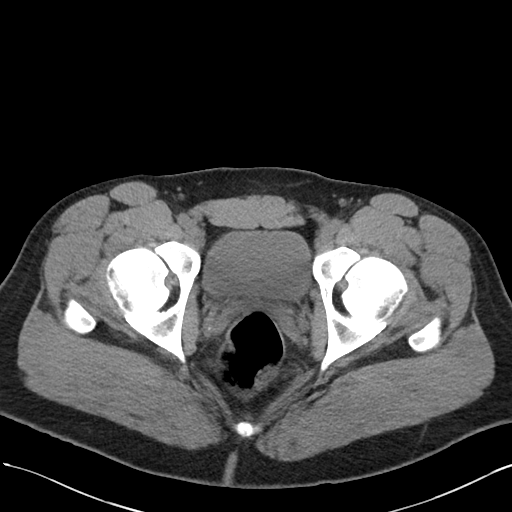
[im 28/100  soft-tissue]
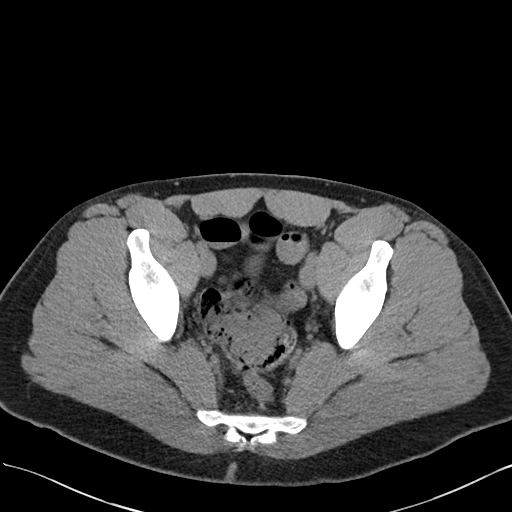
[im 36/100  soft-tissue]
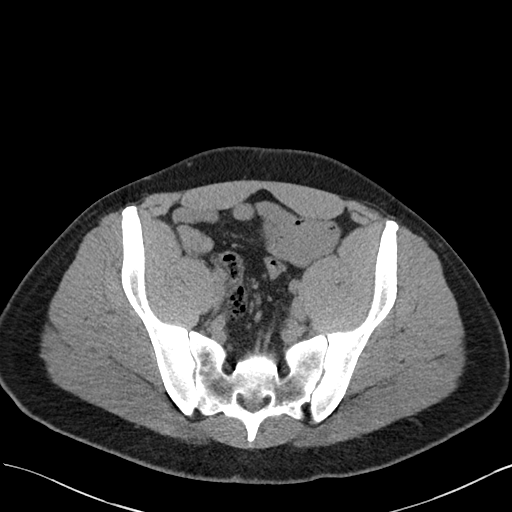
[im 44/100  soft-tissue]
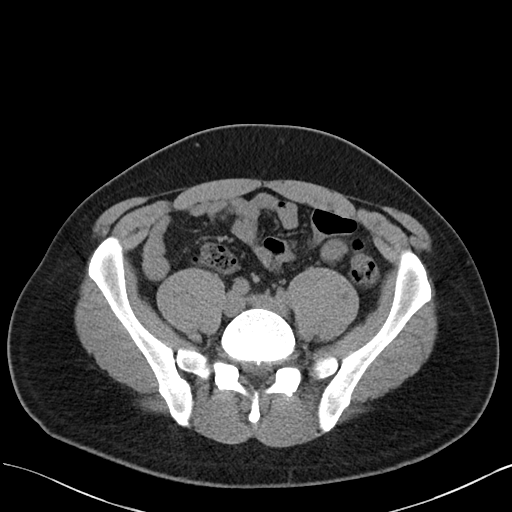
[im 52/100  soft-tissue]
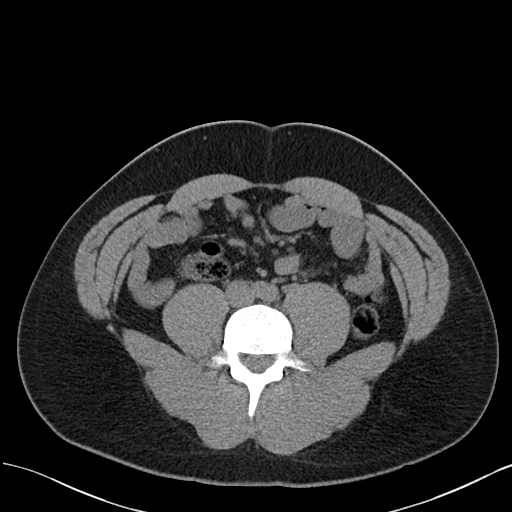
[im 56/100  soft-tissue]
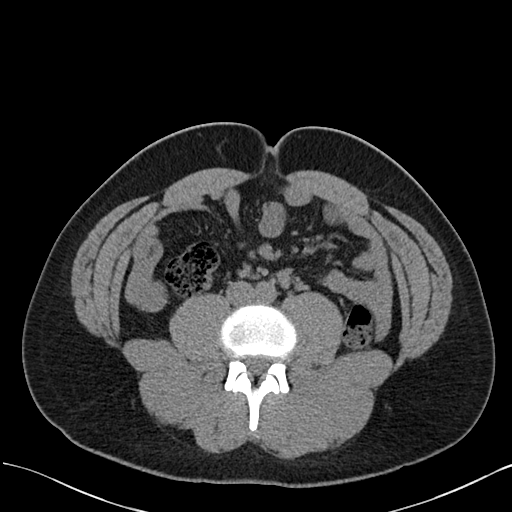
[im 64/100  soft-tissue]
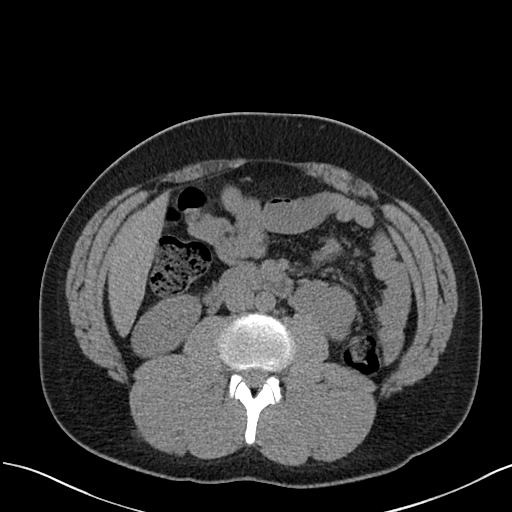
[im 64/100  bone]
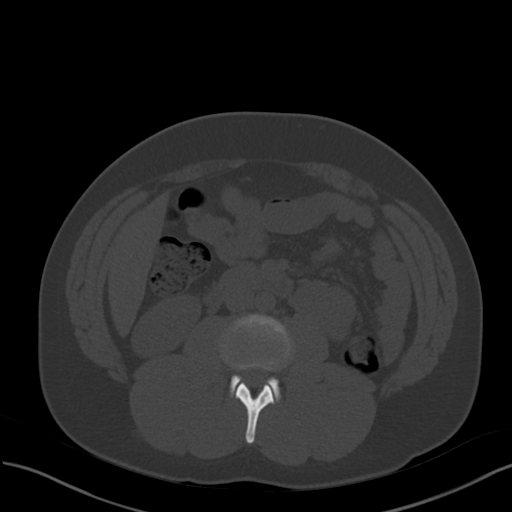
[im 72/100  soft-tissue]
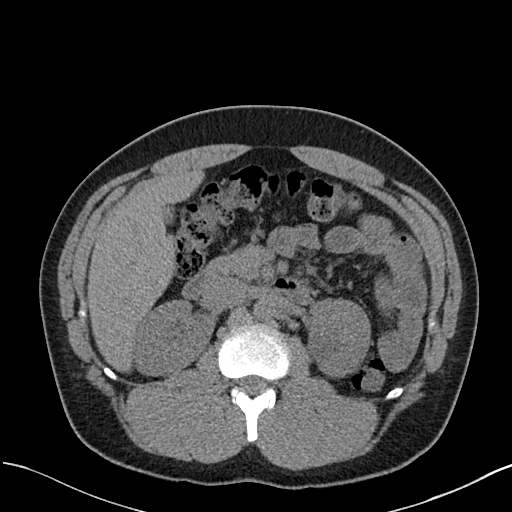
[im 80/100  soft-tissue]
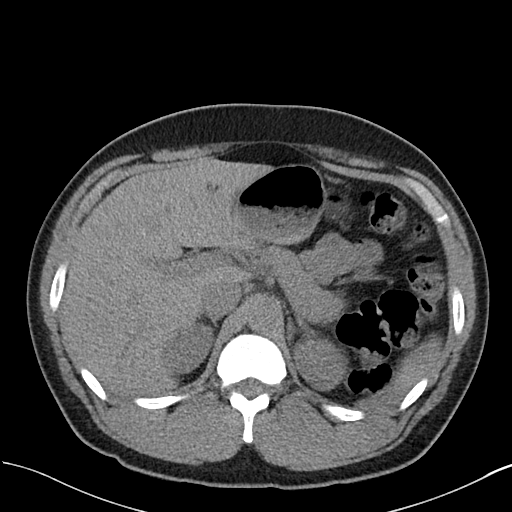
[im 88/100  soft-tissue]
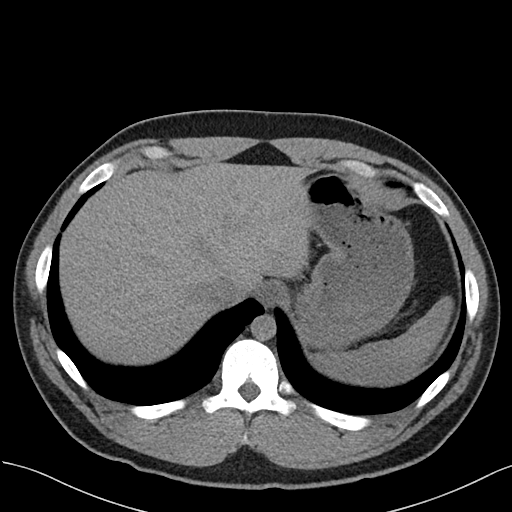
[im 96/100  soft-tissue]
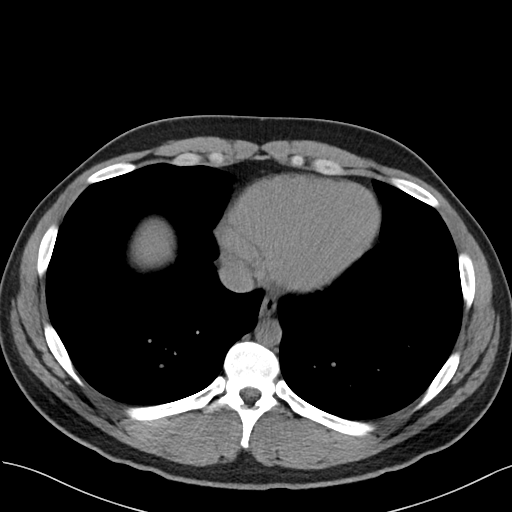

[Series 5: renal stone 3.0 cor · coronal · 0.75mm/px · 3 of 101 slices shown]
[im 34/101  soft-tissue]
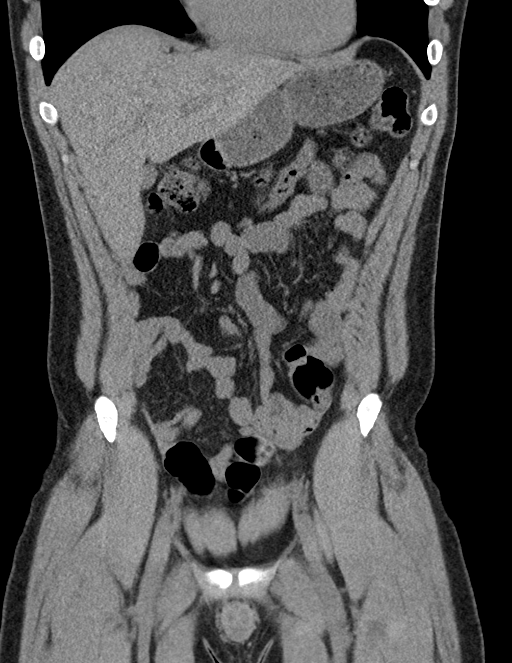
[im 45/101  soft-tissue]
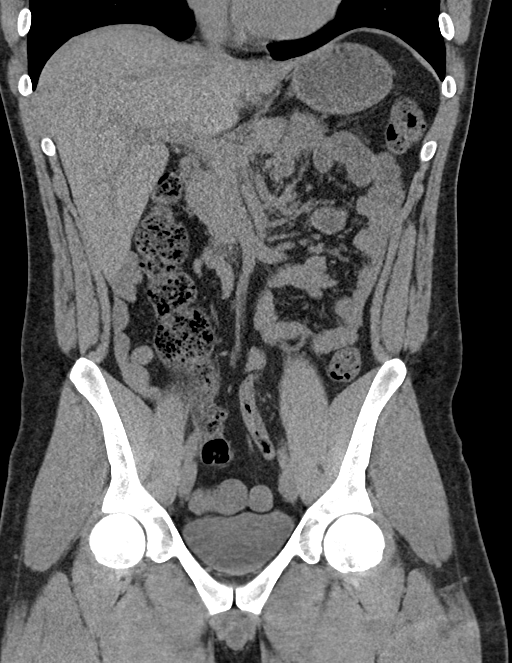
[im 56/101  soft-tissue]
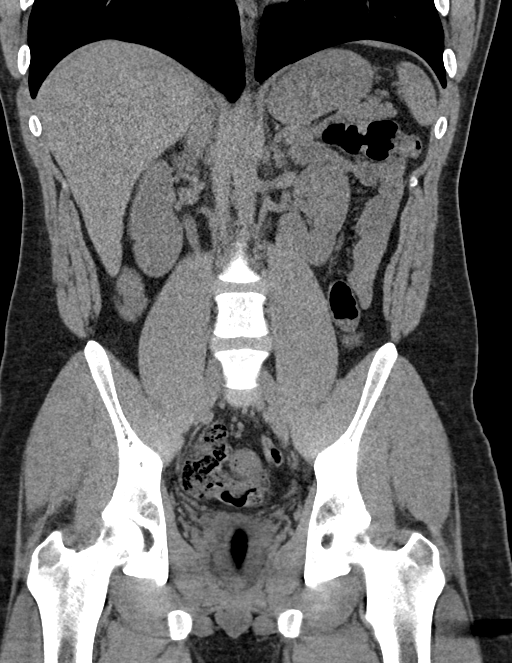

[16 of 46 positions shown; findings below may reference images not displayed]

FINDINGS: Lower chest: Normal

Hepatobiliary: Normal as seen without contrast.

Pancreas: Normal

Spleen: Normal

Adrenals/Urinary Tract: Adrenal glands are normal. The right kidney
is normal. No cyst, mass, stone or hydronephrosis. No passing stone.
The left kidney contains a punctate calcification in the medial
midportion cortex. Probable duplicated collecting system on this
side. No sign of significant stone or passing stone. No stone in the
bladder.

Stomach/Bowel: Normal no sign of ileus, obstruction or inflammatory
process.

Vascular/Lymphatic: New normal

Reproductive: Normal

Other: No free fluid or air

Musculoskeletal: Normal
IMPRESSION: Negative CT study. No cause of flank pain or groin pain is
identified. The only abnormality is a 1 mm calcification in the
medial left kidney which should not be of any significance. No
evidence of hydronephrosis or passing stone.

## 2018-12-04 ENCOUNTER — Emergency Department (HOSPITAL_COMMUNITY)
Admission: EM | Admit: 2018-12-04 | Discharge: 2018-12-04 | Disposition: A | Discharge status: Home / Self Care | Payer: No Typology Code available for payment source | Attending: Emergency Medicine | Admitting: Emergency Medicine

## 2018-12-04 ENCOUNTER — Other Ambulatory Visit: Payer: Self-pay

## 2018-12-04 ENCOUNTER — Emergency Department (HOSPITAL_COMMUNITY): Payer: No Typology Code available for payment source

## 2018-12-04 ENCOUNTER — Encounter (HOSPITAL_COMMUNITY): Payer: Self-pay | Admitting: Emergency Medicine

## 2018-12-04 DIAGNOSIS — Y92410 Unspecified street and highway as the place of occurrence of the external cause: Secondary | ICD-10-CM | POA: Diagnosis not present

## 2018-12-04 DIAGNOSIS — M545 Low back pain, unspecified: Secondary | ICD-10-CM

## 2018-12-04 DIAGNOSIS — Y9389 Activity, other specified: Secondary | ICD-10-CM | POA: Diagnosis not present

## 2018-12-04 DIAGNOSIS — Y999 Unspecified external cause status: Secondary | ICD-10-CM | POA: Diagnosis not present

## 2018-12-04 DIAGNOSIS — Z79899 Other long term (current) drug therapy: Secondary | ICD-10-CM | POA: Diagnosis not present

## 2018-12-04 MED ORDER — METHOCARBAMOL 500 MG PO TABS: 500.0000 mg | ORAL_TABLET | Freq: Two times a day (BID) | ORAL | 0 refills | Status: DC

## 2018-12-04 MED ORDER — NAPROXEN 500 MG PO TABS: 500.0000 mg | ORAL_TABLET | Freq: Two times a day (BID) | ORAL | 0 refills | Status: DC
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
## 2019-09-06 ENCOUNTER — Other Ambulatory Visit: Payer: Self-pay

## 2019-09-06 DIAGNOSIS — Z20822 Contact with and (suspected) exposure to covid-19: Secondary | ICD-10-CM

## 2019-09-07 LAB — NOVEL CORONAVIRUS, NAA: SARS-CoV-2, NAA: NOT DETECTED

## 2019-09-10 ENCOUNTER — Telehealth: Payer: Self-pay

## 2019-09-10 NOTE — Telephone Encounter (Signed)
Called and informed patient that test for Covid 19 was NEGATIVE. Discussed signs and symptoms of Covid 19 : fever, chills, respiratory symptoms, cough, ENT symptoms, sore throat, SOB, muscle pain, diarrhea, headache, loss of taste/smell, close exposure to COVID-19 patient. Pt instructed to call PCP if they develop the above signs and sx. Pt also instructed to call 911 if having respiratory issues/distress. Discussed MyChart enrollment. Pt verbalized understanding.  

## 2024-05-31 ENCOUNTER — Encounter (HOSPITAL_COMMUNITY): Payer: Self-pay | Admitting: *Deleted

## 2024-05-31 ENCOUNTER — Ambulatory Visit (HOSPITAL_COMMUNITY)
Admission: EM | Admit: 2024-05-31 | Discharge: 2024-05-31 | Disposition: A | Discharge status: Home / Self Care | Payer: Self-pay | Attending: Family Medicine | Admitting: Family Medicine

## 2024-05-31 DIAGNOSIS — R1013 Epigastric pain: Secondary | ICD-10-CM | POA: Insufficient documentation

## 2024-05-31 LAB — CBC WITH DIFFERENTIAL/PLATELET
Abs Immature Granulocytes: 0.02 K/uL (ref 0.00–0.07)
Basophils Absolute: 0 K/uL (ref 0.0–0.1)
Basophils Relative: 1 %
Eosinophils Absolute: 0.2 K/uL (ref 0.0–0.5)
Eosinophils Relative: 5 %
HCT: 48.6 % (ref 39.0–52.0)
Hemoglobin: 16.3 g/dL (ref 13.0–17.0)
Immature Granulocytes: 0 %
Lymphocytes Relative: 37 %
Lymphs Abs: 1.8 K/uL (ref 0.7–4.0)
MCH: 30.6 pg (ref 26.0–34.0)
MCHC: 33.5 g/dL (ref 30.0–36.0)
MCV: 91.2 fL (ref 80.0–100.0)
Monocytes Absolute: 0.5 K/uL (ref 0.1–1.0)
Monocytes Relative: 11 %
Neutro Abs: 2.3 K/uL (ref 1.7–7.7)
Neutrophils Relative %: 46 %
Platelets: 251 K/uL (ref 150–400)
RBC: 5.33 MIL/uL (ref 4.22–5.81)
RDW: 12.4 % (ref 11.5–15.5)
WBC: 4.9 K/uL (ref 4.0–10.5)
nRBC: 0 % (ref 0.0–0.2)

## 2024-05-31 LAB — COMPREHENSIVE METABOLIC PANEL WITH GFR
ALT: 34 U/L (ref 0–44)
AST: 28 U/L (ref 15–41)
Albumin: 4.2 g/dL (ref 3.5–5.0)
Alkaline Phosphatase: 39 U/L (ref 38–126)
Anion gap: 12 (ref 5–15)
BUN: 14 mg/dL (ref 6–20)
CO2: 26 mmol/L (ref 22–32)
Calcium: 9 mg/dL (ref 8.9–10.3)
Chloride: 104 mmol/L (ref 98–111)
Creatinine, Ser: 1.31 mg/dL — ABNORMAL HIGH (ref 0.61–1.24)
GFR, Estimated: >60 mL/min (ref >60)
Glucose, Bld: 93 mg/dL (ref 70–99)
Potassium: 3.9 mmol/L (ref 3.5–5.1)
Sodium: 142 mmol/L (ref 135–145)
Total Bilirubin: 0.9 mg/dL (ref 0.0–1.2)
Total Protein: 7.1 g/dL (ref 6.5–8.1)

## 2024-05-31 LAB — LIPASE, BLOOD: Lipase: 145 U/L — ABNORMAL HIGH (ref 11–51)

## 2024-05-31 MED ORDER — ONDANSETRON 4 MG PO TBDP: 4.0000 mg | ORAL_TABLET | Freq: Three times a day (TID) | ORAL | 0 refills | Status: DC | PRN

## 2024-05-31 MED ORDER — FAMOTIDINE 40 MG PO TABS: 40.0000 mg | ORAL_TABLET | Freq: Every day | ORAL | 0 refills | Status: DC

## 2024-05-31 NOTE — ED Triage Notes (Signed)
 Pt states that he has had abdominal pain since Friday, he started vomiting but last time was yesterday. He ate crackers and drank ginger ale today.   He states eh thinks he ate some bad food on Friday.

## 2024-05-31 NOTE — Discharge Instructions (Signed)

## 2024-06-01 ENCOUNTER — Ambulatory Visit (HOSPITAL_COMMUNITY): Payer: Self-pay

## 2024-06-01 NOTE — ED Provider Notes (Signed)
 [facility]   [id number] 05/31/24 Arrival Time: 1858  ASSESSMENT & PLAN:  1. Epigastric pain    CBC/CMP/lipase pending at time of discharge. Cannot r/o pancreatitis but with benign abdomen. No indication for urgent abd imaging at this time.  Meds ordered this encounter  Medications   ondansetron  (ZOFRAN -ODT) 4 MG disintegrating tablet    Sig: Take 1 tablet (4 mg total) by mouth every 8 (eight) hours as needed for nausea or vomiting.    Dispense:  15 tablet    Refill:  0   famotidine  (PEPCID ) 40 MG tablet    Sig: Take 1 tablet (40 mg total) by mouth daily.    Dispense:  30 tablet    Refill:  0     Follow-up Information     [facility].   Specialty: Emergency Medicine Why: If symptoms worsen in any way. Contact information: [address] [place]  [address] [phone]                Reviewed expectations re: course of current medical issues. Questions answered. Outlined signs and symptoms indicating need for more acute intervention. Understanding verbalized. After Visit Summary given.   SUBJECTIVE: History from: Patient. Barry Whitney is a 33 y.o. male. Reports non-radiating epigastric pain; x several days. OOT and drinking a lot of alcohol and eating heavy. With non-bloody emesis until yesterday; no emesis x 24 hours. Slight improvement.  Denies diarrhea/fever. No tx PTA. Tolerating PO intake. Denies recreational drug use.  OBJECTIVE:  Vitals:   05/31/24 1910  BP: 120/76  Pulse: 67  Resp: 18  Temp: 98.8 F (37.1 C)  TempSrc: Oral  SpO2: 95%    General appearance: alert; no distress Eyes: PERRLA; EOMI; conjunctiva normal HENT: [place]; AT Neck: supple  Lungs: speaks full sentences without difficulty; unlabored Abd: soft with mild epigastric TTP; without guarding/rebound ; + BS Extremities: no edema Skin: warm and dry Neurologic: normal gait Psychological: alert and  cooperative; normal mood and affect  Labs:  Labs Reviewed  COMPREHENSIVE METABOLIC PANEL WITH GFR - Abnormal; Notable for the following components:      Result Value   Creatinine, Ser 1.31 (*)    All other components within normal limits  LIPASE, BLOOD - Abnormal; Notable for the following components:   Lipase 145 (*)    All other components within normal limits  CBC WITH DIFFERENTIAL/PLATELET    Imaging: No results found.  No Known Allergies  History reviewed. No pertinent past medical history. Social History   Socioeconomic History   Marital status: Single    Spouse name: Not on file   Number of children: Not on file   Years of education: Not on file   Highest education level: Not on file  Occupational History   Occupation: labor    Employer: [employer]  Tobacco Use   Smoking status: Never   Smokeless tobacco: Never  Vaping Use   Vaping status: Never Used  Substance and Sexual Activity   Alcohol use: No    Alcohol/week: 0.0 standard drinks of alcohol   Drug use: No   Sexual activity: Not Currently    Birth control/protection: None  Other Topics Concern   Not on file  Social History Narrative   Not on file   Social Drivers of Health   Financial Resource Strain: Not on file  Food Insecurity: Not on file  Transportation Needs: Not on file  Physical Activity: Not on file  Stress: Not on file (08/20/2023)  Social Connections: Not on file  Intimate Partner Violence: Not on file   History reviewed. No pertinent family history. History reviewed. No pertinent surgical history.   Rolinda Rogue, MD 06/01/24 [phone]

## 2024-08-04 ENCOUNTER — Ambulatory Visit (HOSPITAL_COMMUNITY)
Admission: EM | Admit: 2024-08-04 | Discharge: 2024-08-04 | Disposition: A | Discharge status: Home / Self Care | Payer: Self-pay

## 2024-08-04 ENCOUNTER — Encounter (HOSPITAL_COMMUNITY): Payer: Self-pay | Admitting: Emergency Medicine

## 2024-08-04 DIAGNOSIS — S29019A Strain of muscle and tendon of unspecified wall of thorax, initial encounter: Secondary | ICD-10-CM

## 2024-08-04 MED ORDER — CYCLOBENZAPRINE HCL 5 MG PO TABS: 5.0000 mg | ORAL_TABLET | Freq: Three times a day (TID) | ORAL | 0 refills | Status: AC | PRN

## 2024-08-04 MED ORDER — DICLOFENAC SODIUM 75 MG PO TBEC: 75.0000 mg | DELAYED_RELEASE_TABLET | Freq: Two times a day (BID) | ORAL | 0 refills | Status: AC

## 2024-08-04 NOTE — ED Triage Notes (Addendum)
 Pt reports since yesterday having mid to lower back pains. Hasn't taken any measures besides rest for pain. Denies any injury or fall. Reports at work does sometimes have to push/pull. Denies bowel or bladder problems

## 2024-08-04 NOTE — Discharge Instructions (Addendum)

## 2024-08-04 NOTE — ED Provider Notes (Signed)
 [facility]    CSN: [id number] Arrival date & time: 08/04/24  1225      History   Chief Complaint Chief Complaint  Patient presents with   Back Pain    HPI Barry Whitney is a 33 y.o. male.   Patient presents today due to with upper back pain for the past 2 days.  Patient states that he thinks that he tweaked something in his back while he is at work.  Patient denies use of medicine for symptoms but states that he had someone walk on his back for him, denies relief.  The history is provided by the patient.  Back Pain   History reviewed. No pertinent past medical history.  There are no active problems to display for this patient.   History reviewed. No pertinent surgical history.     Home Medications    Prior to Admission medications   Medication Sig Start Date End Date Taking? Authorizing Provider  cyclobenzaprine  (FLEXERIL ) 5 MG tablet Take 1 tablet (5 mg total) by mouth 3 (three) times daily as needed for muscle spasms. 08/04/24  Yes Andra Corean BROCKS, PA-C  diclofenac (VOLTAREN) 75 MG EC tablet Take 1 tablet (75 mg total) by mouth 2 (two) times daily. 08/04/24  Yes Andra Corean BROCKS, PA-C    Family History No family history on file.  Social History Social History   Tobacco Use   Smoking status: Never   Smokeless tobacco: Never  Vaping Use   Vaping status: Never Used  Substance Use Topics   Alcohol use: No    Alcohol/week: 0.0 standard drinks of alcohol   Drug use: No     Allergies   Patient has no known allergies.   Review of Systems Review of Systems  Musculoskeletal:  Positive for back pain.     Physical Exam Triage Vital Signs ED Triage Vitals  Encounter Vitals Group     BP 08/04/24 1247 110/63     Girls Systolic BP Percentile --      Girls Diastolic BP Percentile --      Boys Systolic BP Percentile --      Boys Diastolic BP Percentile --      Pulse Rate 08/04/24 1247 68     Resp 08/04/24 1247 14     Temp  08/04/24 1247 98.5 F (36.9 C)     Temp Source 08/04/24 1247 Oral     SpO2 08/04/24 1247 98 %     Weight --      Height --      Head Circumference --      Peak Flow --      Pain Score 08/04/24 1246 7     Pain Loc --      Pain Education --      Exclude from Growth Chart --    No data found.  Updated Vital Signs BP 110/63 (BP Location: Right Arm)   Pulse 68   Temp 98.5 F (36.9 C) (Oral)   Resp 14   SpO2 98%   Visual Acuity Right Eye Distance:   Left Eye Distance:   Bilateral Distance:    Right Eye Near:   Left Eye Near:    Bilateral Near:     Physical Exam Vitals and nursing note reviewed.  Constitutional:      General: He is not in acute distress.    Appearance: Normal appearance. He is not ill-appearing, toxic-appearing or diaphoretic.  Eyes:     General: No scleral icterus.  Cardiovascular:     Rate and Rhythm: Normal rate and regular rhythm.     Heart sounds: Normal heart sounds.  Pulmonary:     Effort: Pulmonary effort is normal. No respiratory distress.     Breath sounds: Normal breath sounds. No wheezing or rhonchi.  Musculoskeletal:     Comments: Pain elicited with active range of motion of thoracic spine, most significantly standing up from a flexed position.  Tenderness to palpation of paraspinous muscles of thoracic spine as well.  Skin:    General: Skin is warm.  Neurological:     Mental Status: He is alert and oriented to person, place, and time.  Psychiatric:        Mood and Affect: Mood normal.        Behavior: Behavior normal.      UC Treatments / Results  Labs (all labs ordered are listed, but only abnormal results are displayed) Labs Reviewed - No data to display  EKG   Radiology No results found.  Procedures Procedures (including critical care time)  Medications Ordered in UC Medications - No data to display  Initial Impression / Assessment and Plan / UC Course  I have reviewed the triage vital signs and the nursing  notes.  Pertinent labs & imaging results that were available during my care of the patient were reviewed by me and considered in my medical decision making (see chart for details).     Patient was offered Toradol injection in office patient declined.  Patient was sent home with diclofenac 75 mg twice daily as needed for pain as well as cyclobenzaprine  5 mg 3 times daily as needed for of her pain.  Patient instructed that cyclobenzaprine  may make him sleepy so he needs to take this at night when he is not working or operating heavy machinery. Final Clinical Impressions(s) / UC Diagnoses   Final diagnoses:  Strain of thoracic spine, initial encounter     Discharge Instructions      Today you have been diagnosed with a musculoskeletal injury.  You should use ice on affected area for 20 minutes at a time a couple times a day for the first 24 hours then you may switch to heat in the same intervals.  Be sure to put a barrier between ice or heat source and skin to prevent burns.  May also wrap affected area and Ace bandage if tolerated and appropriate, and elevate above the level of the heart to help reduce swelling.  Do not wrap Ace bandages around neck or torso as wrapping too tight can restrict air movement inability to breathe.  If symptoms do not seem to be improving in 3 to 5 days after following these instructions we need to follow-up with orthopedist or PCP.     ED Prescriptions     Medication Sig Dispense Auth. Provider   cyclobenzaprine  (FLEXERIL ) 5 MG tablet Take 1 tablet (5 mg total) by mouth 3 (three) times daily as needed for muscle spasms. 30 tablet Andra Krabbe C, PA-C   diclofenac (VOLTAREN) 75 MG EC tablet Take 1 tablet (75 mg total) by mouth 2 (two) times daily. 30 tablet Andra Krabbe BROCKS, PA-C      PDMP not reviewed this encounter.   Andra Krabbe BROCKS, PA-C 08/04/24 1457
